# Patient Record
Sex: Male | Born: 1945 | Race: White | Hispanic: No | Marital: Married | Smoking: Never smoker
Health system: Southern US, Community
[De-identification: ages and names within clinical notes are randomized; demographics above are authoritative.]

## PROBLEM LIST (undated history)

## (undated) ENCOUNTER — Emergency Department (HOSPITAL_COMMUNITY): Admission: EM | Discharge status: Absent without leave | Payer: Medicare PPO

## (undated) DIAGNOSIS — M1990 Unspecified osteoarthritis, unspecified site: Secondary | ICD-10-CM

## (undated) DIAGNOSIS — T7840XA Allergy, unspecified, initial encounter: Secondary | ICD-10-CM

## (undated) DIAGNOSIS — J45909 Unspecified asthma, uncomplicated: Secondary | ICD-10-CM

## (undated) DIAGNOSIS — R7303 Prediabetes: Secondary | ICD-10-CM

## (undated) DIAGNOSIS — T8859XA Other complications of anesthesia, initial encounter: Secondary | ICD-10-CM

## (undated) DIAGNOSIS — F909 Attention-deficit hyperactivity disorder, unspecified type: Secondary | ICD-10-CM

## (undated) DIAGNOSIS — E785 Hyperlipidemia, unspecified: Secondary | ICD-10-CM

## (undated) DIAGNOSIS — I10 Essential (primary) hypertension: Secondary | ICD-10-CM

## (undated) DIAGNOSIS — F419 Anxiety disorder, unspecified: Secondary | ICD-10-CM

## (undated) HISTORY — DX: Unspecified asthma, uncomplicated: J45.909

## (undated) HISTORY — DX: Unspecified osteoarthritis, unspecified site: M19.90

## (undated) HISTORY — PX: CERVICAL FUSION: SHX112

## (undated) HISTORY — PX: OTHER SURGICAL HISTORY: SHX169

## (undated) HISTORY — DX: Hyperlipidemia, unspecified: E78.5

## (undated) HISTORY — PX: COLONOSCOPY: SHX174

## (undated) HISTORY — PX: LUMBAR FUSION: SHX111

## (undated) HISTORY — DX: Allergy, unspecified, initial encounter: T78.40XA

---
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
## 2008-01-11 HISTORY — PX: LUMBAR FUSION: SHX111

## 2012-11-16 LAB — LIPID PANEL
HDL: 62 (ref 35–70)
Triglycerides: 79 (ref 40–160)

## 2012-11-16 LAB — CBC AND DIFFERENTIAL: HCT: 49 (ref 41–53)

## 2012-11-16 LAB — CBC: RBC: 5.16 — AB (ref 3.87–5.11)

## 2013-09-11 LAB — CBC: RBC: 5.05 (ref 3.87–5.11)

## 2013-09-11 LAB — LIPID PANEL
Cholesterol: 186 (ref 0–200)
HDL: 56 (ref 35–70)
LDL Cholesterol: 110
Triglycerides: 98 (ref 40–160)

## 2013-09-11 LAB — CBC AND DIFFERENTIAL
HCT: 48 (ref 41–53)
Neutrophils Absolute: 4
WBC: 5

## 2014-01-10 HISTORY — PX: CERVICAL FUSION: SHX112

## 2015-11-26 LAB — CBC: RBC: 5.04 (ref 3.87–5.11)

## 2015-11-26 LAB — CBC AND DIFFERENTIAL
Hemoglobin: 46.4 — AB (ref 13.5–17.5)
WBC: 4.7

## 2016-02-29 LAB — HEMOGLOBIN A1C: Hemoglobin A1C: 5.9

## 2017-01-04 ENCOUNTER — Encounter: Payer: Self-pay | Admitting: Emergency Medicine

## 2017-01-04 ENCOUNTER — Ambulatory Visit
Admission: EM | Admit: 2017-01-04 | Discharge: 2017-01-04 | Disposition: A | Discharge status: Home / Self Care | Payer: Medicare PPO | Attending: Emergency Medicine | Admitting: Emergency Medicine

## 2017-01-04 ENCOUNTER — Other Ambulatory Visit: Payer: Self-pay

## 2017-01-04 DIAGNOSIS — J309 Allergic rhinitis, unspecified: Secondary | ICD-10-CM | POA: Diagnosis not present

## 2017-01-04 DIAGNOSIS — J019 Acute sinusitis, unspecified: Secondary | ICD-10-CM

## 2017-01-04 HISTORY — DX: Essential (primary) hypertension: I10

## 2017-01-04 MED ORDER — AEROCHAMBER PLUS MISC: 2 refills | Status: DC

## 2017-01-04 MED ORDER — IPRATROPIUM BROMIDE 0.06 % NA SOLN: 2.0000 | Freq: Four times a day (QID) | NASAL | 0 refills | Status: AC

## 2017-01-04 MED ORDER — ALBUTEROL SULFATE HFA 108 (90 BASE) MCG/ACT IN AERS: 1.0000 | INHALATION_SPRAY | Freq: Four times a day (QID) | RESPIRATORY_TRACT | 0 refills | Status: AC | PRN

## 2017-01-04 MED ORDER — PREDNISONE 20 MG PO TABS: 40.0000 mg | ORAL_TABLET | Freq: Every day | ORAL | 0 refills | Status: AC

## 2017-01-04 NOTE — Discharge Instructions (Signed)
Take the medication as written.  You may take 600 mg of motrin with 1 gram of tylenol up to 3-4 times a day as needed for pain. This is an effective combination for pain.  Your sinus infection appears to be from allergies, and does not appear to be infectious at this time.  I do not think that you need antibiotics right now  unless you have a high fever, have had this for 10 days, or you get better and then get sick again. Use a neti pot or the NeilMed sinus rinse as often as you want to to reduce nasal congestion. Follow the directions on the box.   discontinue the Zyrtec, start Claritin-D, or Allegra-D, and switch to plain Claritin or Allegra if your blood pressure goes up.  Prednisone 40 mg for 5 days, Atrovent nasal spray, saline nasal irrigation, albuterol inhaler with a spacer in case you start coughing or wheezing, and will provide a primary care referral.  May return here as needed.  Here is a list of primary care providers who are taking new patients:  Dr. Elizabeth Sauereanna Jones, Dr. Schuyler AmorWilliam Plonk [address] [place] [address] [phone]  [facility] [place] [address]  [place] [address]  [phone]  [facility] [address]  [place], [place] [address] [phone]  [facility] [place] [address]  [phone] [place], [place] [address]  Here are clinics/ other resources who will see you if you do not have insurance. Some have certain criteria that you must meet. Call them and find out what they are:  [facility]: [address] [place], [place] [address] Phone: [phone] Hours: First and Third Saturdays of each Month, 9 a.m. - 1 p.m.  [facility]: [address], [place], [place] [address] Phone: [phone] Hours: Tuesday, 4 p.m. - 8 p.m. Thursday, 1 p.m. - 8 p.m. Wednesday, 9 a.m. - [facility]  [facility] [address], [place], [place] [address] Phone: [phone] Pharmacy Phone  Number: [phone] Dental Phone Number: [phone] [facility] Insurance Help: [phone]  Dental Hours: Monday - Thursday, 8 a.m. - 6 p.m.  [facility] [address] [place], [place] [address] Phone: [phone] Pharmacy Phone Number: [phone] [facility] Insurance Help: [phone]  [facility] [address] [place], [place] [address] Phone: [phone] Pharmacy Phone Number: [phone] [facility] Insurance Help: [phone]  [facility] [address] [place], [place] [address] Phone: [phone] [facility] Insurance Help: [phone]   [facility] [address] [place], [place] [address] Phone: [phone]  Go to www.goodrx.com to look up your medications. This will give you a list of where you can find your prescriptions at the most affordable prices. Or ask the pharmacist what the cash price is, or if they have any other discount programs available to help make your medication more affordable. This can be less expensive than what you would pay with insurance.

## 2017-01-04 NOTE — ED Provider Notes (Signed)
HPI  SUBJECTIVE:  Gabriel Simpson is a 71 y.o. male who presents with tan/brown nasal congestion, rhinorrhea, postnasal drip, sneezing, itchy, watery eyes after being exposed to dusty boxes while packing for the past week.  He states that his face feels swollen especially in the morning.  Reports sinus pain and pressure, especially on the left maxillary sinuses.  Symptoms started 2 days ago. No cough, sore throat, ear pain.  His upper teeth do not hurt.  He is currently not doing any saline nasal irrigation or using any nasal steroid no coughing at night.  He had a sick contact last week with a sinus infection.  He tried Mucinex for day, Sudafed for a day.  He is on Zyrtec chronically.  He also tried Tylenol.  No alleviating factors.  No aggravating factors.  Past medical history of asthma, allergies, hypertension, sinusitis.  No history of diabetes.  [place] [facility]  States that they just moved here.    Past Medical History:  Diagnosis Date  . Hypertension     Past Surgical History:  Procedure Laterality Date  . thumb surgery Left     History reviewed. No pertinent family history.  Social History   Tobacco Use  . Smoking status: Never Smoker  . Smokeless tobacco: Never Used  Substance Use Topics  . Alcohol use: No    Frequency: Never  . Drug use: No    No current facility-administered medications for this encounter.   Current Outpatient Medications:  .  atorvastatin (LIPITOR) 20 MG tablet, Take 20 mg by mouth daily., Disp: , Rfl:  .  finasteride (PROSCAR) 5 MG tablet, Take 5 mg by mouth daily., Disp: , Rfl:  .  gabapentin (NEURONTIN) 100 MG capsule, Take 100 mg by mouth at bedtime., Disp: , Rfl:  .  losartan-hydrochlorothiazide (HYZAAR) 100-12.5 MG tablet, Take 1 tablet by mouth daily., Disp: , Rfl:  .  Multiple Vitamin (MULTIVITAMIN) tablet, Take 1 tablet by mouth daily., Disp: , Rfl:  .  sertraline (ZOLOFT) 50 MG tablet, Take 50 mg by mouth daily., Disp: , Rfl:  .   tamsulosin (FLOMAX) 0.4 MG CAPS capsule, Take 0.4 mg by mouth daily., Disp: , Rfl:  .  albuterol (PROVENTIL HFA;VENTOLIN HFA) 108 (90 Base) MCG/ACT inhaler, Inhale 1-2 puffs into the lungs every 6 (six) hours as needed for wheezing or shortness of breath., Disp: 1 Inhaler, Rfl: 0 .  ipratropium (ATROVENT) 0.06 % nasal spray, Place 2 sprays into both nostrils 4 (four) times daily. 3-4 times/ day, Disp: 15 mL, Rfl: 0 .  predniSONE (DELTASONE) 20 MG tablet, Take 2 tablets (40 mg total) by mouth daily with breakfast for 5 days., Disp: 10 tablet, Rfl: 0 .  Spacer/Aero-Holding Chambers (AEROCHAMBER PLUS) inhaler, Use as instructed, Disp: 1 each, Rfl: 2  No Known Allergies   ROS  As noted in HPI.   Physical Exam  BP 136/87 (BP Location: Left Arm)   Pulse 71   Temp 98.8 F (37.1 C) (Oral)   Resp 16   Ht 6' (1.829 m)   Wt 185 lb (83.9 kg)   SpO2 98%   BMI 25.09 kg/m   Constitutional: Well developed, well nourished, no acute distress Eyes:  EOMI, conjunctiva normal bilaterally HENT: Normocephalic, atraumatic,mucus membranes moist positive clear rhinorrhea.  Erythematous, but not swollen turbinates.  No sinus tenderness.  No appreciable facial swelling.  Normal oropharynx.  Positive postnasal drip.  Some cobblestoning. Respiratory: Normal inspiratory effort, lungs clear bilaterally. Cardiovascular: Normal rate GI:  nondistended skin: No rash, skin intact Musculoskeletal: no deformities Neurologic: Alert & oriented x 3, no focal neuro deficits Psychiatric: Speech and behavior appropriate   ED Course   Medications - No data to display  No orders of the defined types were placed in this encounter.   No results found for this or any previous visit (from the past 24 hour(s)). No results found.  ED Clinical Impression  Acute non-recurrent sinusitis, unspecified location  Allergic rhinitis, unspecified seasonality, unspecified trigger   ED Assessment/Plan  Presentation  consistent with allergic rhinitis/sinusitis.  We will have him discontinue the Zyrtec, start Claritin-D, or Allegra-D, and switch to plain Claritin or Allegra if his blood pressure goes up.  Prednisone 40 mg for 5 days, Atrovent nasal spray because this is worked well for him in the past, saline nasal irrigation, butyryl inhaler with a spacer in case he does start coughing or wheezing, and will provide a primary care referral addition to primary care referral list for routine management of his hypertension.  May return here as needed.   Meds ordered this encounter  Medications  . ipratropium (ATROVENT) 0.06 % nasal spray    Sig: Place 2 sprays into both nostrils 4 (four) times daily. 3-4 times/ day    Dispense:  15 mL    Refill:  0  . predniSONE (DELTASONE) 20 MG tablet    Sig: Take 2 tablets (40 mg total) by mouth daily with breakfast for 5 days.    Dispense:  10 tablet    Refill:  0  . Spacer/Aero-Holding Chambers (AEROCHAMBER PLUS) inhaler    Sig: Use as instructed    Dispense:  1 each    Refill:  2  . albuterol (PROVENTIL HFA;VENTOLIN HFA) 108 (90 Base) MCG/ACT inhaler    Sig: Inhale 1-2 puffs into the lungs every 6 (six) hours as needed for wheezing or shortness of breath.    Dispense:  1 Inhaler    Refill:  0    *This clinic note was created using Scientist, clinical (histocompatibility and immunogenetics)Dragon dictation software. Therefore, there may be occasional mistakes despite careful proofreading.   ?    Domenick GongMortenson, Alanny Rivers, MD 01/04/17 [phone]

## 2017-01-04 NOTE — ED Triage Notes (Signed)
Patient c/o sinus congestion and pressure that started 2 days ago. Patient denies fevers.  

## 2017-02-27 LAB — COMPREHENSIVE METABOLIC PANEL
Albumin: 3.8 (ref 3.5–5.0)
Calcium: 9.3 (ref 8.7–10.7)
GFR calc Af Amer: 91
GFR calc non Af Amer: 75

## 2017-02-27 LAB — BASIC METABOLIC PANEL
BUN: 20 (ref 4–21)
CO2: 24 — AB (ref 13–22)
Chloride: 100 (ref 99–108)
Creatinine: 1 (ref 0.6–1.3)
Glucose: 124
Potassium: 4.1 (ref 3.4–5.3)
Sodium: 138 (ref 137–147)

## 2017-02-27 LAB — CBC AND DIFFERENTIAL
HCT: 47 (ref 41–53)
Hemoglobin: 14.9 (ref 13.5–17.5)
Neutrophils Absolute: 4
Platelets: 246 (ref 150–399)
WBC: 5.3

## 2017-02-27 LAB — LIPID PANEL
Cholesterol: 181 (ref 0–200)
HDL: 48 (ref 35–70)
LDL Cholesterol: 86
Triglycerides: 248 — AB (ref 40–160)

## 2017-02-27 LAB — HEPATIC FUNCTION PANEL
ALT: 28 (ref 10–40)
AST: 26 (ref 14–40)
Alkaline Phosphatase: 44 (ref 25–125)
Bilirubin, Total: 1.1

## 2017-02-27 LAB — CBC: RBC: 4.98 (ref 3.87–5.11)

## 2017-02-27 LAB — TSH: TSH: 0.9 (ref 0.41–5.90)

## 2017-04-20 LAB — CBC AND DIFFERENTIAL
HCT: 48 (ref 41–53)
Hemoglobin: 16.1 (ref 13.5–17.5)
Neutrophils Absolute: 3
Platelets: 266 (ref 150–399)
WBC: 4.9

## 2017-04-20 LAB — COMPREHENSIVE METABOLIC PANEL
Albumin: 4.4 (ref 3.5–5.0)
Calcium: 10 (ref 8.7–10.7)
GFR calc Af Amer: 98
GFR calc non Af Amer: 81

## 2017-04-20 LAB — BASIC METABOLIC PANEL
BUN: 17 (ref 4–21)
CO2: 25 — AB (ref 13–22)
Chloride: 101 (ref 99–108)
Creatinine: 0.9 (ref 0.6–1.3)
Glucose: 81
Potassium: 4.4 (ref 3.4–5.3)
Sodium: 138 (ref 137–147)

## 2017-04-20 LAB — HEPATIC FUNCTION PANEL
ALT: 25 (ref 10–40)
AST: 30 (ref 14–40)
Alkaline Phosphatase: 43 (ref 25–125)
Bilirubin, Total: 1.2

## 2017-04-20 LAB — CBC: RBC: 5.08 (ref 3.87–5.11)

## 2017-11-17 LAB — BASIC METABOLIC PANEL
BUN: 15 (ref 4–21)
CO2: 29 — AB (ref 13–22)
Chloride: 101 (ref 99–108)
Creatinine: 0.9 (ref 0.6–1.3)
Glucose: 93
Potassium: 4.6 (ref 3.4–5.3)
Sodium: 139 (ref 137–147)

## 2017-11-17 LAB — HEPATIC FUNCTION PANEL
ALT: 22 (ref 10–40)
AST: 22 (ref 14–40)
Alkaline Phosphatase: 59 (ref 25–125)
Bilirubin, Total: 0.9

## 2017-11-17 LAB — TSH: TSH: 0.9 (ref <=5.90)

## 2017-11-17 LAB — LIPID PANEL
Cholesterol: 191 (ref 0–200)
HDL: 50 (ref 35–70)
LDL Cholesterol: 112
Triglycerides: 147 (ref 40–160)

## 2017-11-17 LAB — CBC AND DIFFERENTIAL
HCT: 45 (ref 41–53)
Hemoglobin: 14.3 (ref 13.5–17.5)
Neutrophils Absolute: 4
Platelets: 310 (ref 150–399)
WBC: 5.1

## 2017-11-17 LAB — COMPREHENSIVE METABOLIC PANEL
Albumin: 3.9 (ref 3.5–5.0)
Calcium: 9.3 (ref 8.7–10.7)
GFR calc Af Amer: 104
GFR calc non Af Amer: 86

## 2017-11-17 LAB — HEMOGLOBIN A1C: Hemoglobin A1C: 6.2

## 2017-11-17 LAB — CBC: RBC: 5.26 — AB (ref 3.87–5.11)

## 2018-01-10 HISTORY — PX: EYE SURGERY: SHX253

## 2019-06-14 ENCOUNTER — Encounter: Payer: Self-pay | Admitting: General Practice

## 2019-06-18 ENCOUNTER — Encounter: Payer: Self-pay | Admitting: General Practice

## 2019-06-18 ENCOUNTER — Encounter: Payer: Medicare PPO | Admitting: Family Medicine

## 2019-06-18 NOTE — Progress Notes (Signed)
Care Everywhere/thx dmf 

## 2019-06-26 ENCOUNTER — Other Ambulatory Visit: Payer: Self-pay

## 2019-06-27 ENCOUNTER — Encounter: Payer: Self-pay | Admitting: Family Medicine

## 2019-06-27 ENCOUNTER — Ambulatory Visit (INDEPENDENT_AMBULATORY_CARE_PROVIDER_SITE_OTHER): Payer: Medicare PPO | Admitting: Family Medicine

## 2019-06-27 VITALS — BP 120/78 | HR 87 | Temp 98.3°F | Ht 72.0 in | Wt 185.4 lb

## 2019-06-27 DIAGNOSIS — F419 Anxiety disorder, unspecified: Secondary | ICD-10-CM | POA: Insufficient documentation

## 2019-06-27 DIAGNOSIS — F900 Attention-deficit hyperactivity disorder, predominantly inattentive type: Secondary | ICD-10-CM | POA: Insufficient documentation

## 2019-06-27 MED ORDER — AMPHETAMINE-DEXTROAMPHET ER 20 MG PO CP24: 20.0000 mg | ORAL_CAPSULE | Freq: Every day | ORAL | 0 refills | Status: DC

## 2019-06-27 MED ORDER — SERTRALINE HCL 50 MG PO TABS: 50.0000 mg | ORAL_TABLET | Freq: Every day | ORAL | 1 refills | Status: AC

## 2019-06-27 MED ORDER — AMPHETAMINE-DEXTROAMPHET ER 20 MG PO CP24: 20.0000 mg | ORAL_CAPSULE | ORAL | 0 refills | Status: DC

## 2019-06-27 MED ORDER — AMPHETAMINE-DEXTROAMPHET ER 20 MG PO CP24: 20.0000 mg | ORAL_CAPSULE | ORAL | 0 refills | Status: AC

## 2019-06-27 NOTE — Patient Instructions (Signed)
Amphetamine; Dextroamphetamine extended-release capsules What is this medicine? AMPHETAMINE; DEXTROAMPHETAMINE (am FET a meen; dex troe am FET a meen) is used to treat attention-deficit hyperactivity disorder (ADHD). Federal law prohibits giving this medicine to any person other than the person for whom it was prescribed. Do not share this medicine with anyone else. This medicine may be used for other purposes; ask your health care provider or pharmacist if you have questions. COMMON BRAND NAME(S): Adderall XR, Mydayis What should I tell my health care provider before I take this medicine? They need to know if you have any of these conditions:  anxiety or panic attacks  circulation problems in fingers and toes  glaucoma  hardening or blockages of the arteries or heart blood vessels  heart disease or a heart defect  high blood pressure  history of a drug or alcohol abuse problem  history of stroke  kidney disease  liver disease  mental illness  seizures  suicidal thoughts, plans, or attempt; a previous suicide attempt by you or a family member  thyroid disease  Tourette's syndrome  an unusual or allergic reaction to dextroamphetamine, other amphetamines, other medicines, foods, dyes, or preservatives  pregnant or trying to get pregnant  breast-feeding How should I use this medicine? Take this medicine by mouth with a glass of water. Follow the directions on the prescription label. This medicine is taken just one time per day, usually in the morning after waking up. Take with or without food. Do not chew or crush this medicine. You may open the capsules and sprinkle the medicine on a spoonful of applesauce. If sprinkled on applesauce, take the dose immediately and do not crush or chew. Do not take your medicine more often than directed. A special MedGuide will be given to you by the pharmacist with each prescription and refill. Be sure to read this information carefully  each time. Talk to your pediatrician regarding the use of this medicine in children. While this drug may be prescribed for children as young as 6 years for selected conditions, precautions do apply. Some extended-release capsules are recommended for use only in children 13 years of age and older. Overdosage: If you think you have taken too much of this medicine contact a poison control center or emergency room at once. NOTE: This medicine is only for you. Do not share this medicine with others. What if I miss a dose? If you miss a dose, take it as soon as you can in the morning, but do not take it later in the day because it can cause trouble sleeping. If it is almost time for your next dose, take only that dose. Do not take double or extra doses. What may interact with this medicine? Do not take this medicine with any of the following medications:  MAOIs like Carbex, Eldepryl, Marplan, Nardil, and Parnate  other stimulant medicines for attention disorders This medicine may also interact with the following medications:  acetazolamide  alcohol  ammonium chloride  antacids  ascorbic acid  atomoxetine  caffeine  certain medicines for blood pressure  certain medicines for depression, anxiety, or psychotic disturbances  certain medicines for seizures like carbamazepine, phenobarbital, phenytoin  certain medicines for stomach problems like cimetidine, ranitidine, famotidine, esomeprazole, omeprazole, lansoprazole, pantoprazole  lithium  medicines for colds and breathing difficulties  medicines for diabetes  medicines or dietary supplements for weight loss or to stay awake  methenamine  narcotic medicines for pain  quinidine  ritonavir  sodium bicarbonate  St.   John's wort This list may not describe all possible interactions. Give your health care provider a list of all the medicines, herbs, non-prescription drugs, or dietary supplements you use. Also tell them if you  smoke, drink alcohol, or use illegal drugs. Some items may interact with your medicine. What should I watch for while using this medicine? Visit your doctor or health care professional for regular checks on your progress. This prescription requires that you follow special procedures with your doctor and pharmacy. You will need to have a new written prescription from your doctor every time you need a refill. This medicine may affect your concentration, or hide signs of tiredness. Until you know how this medicine affects you, do not drive, ride a bicycle, use machinery, or do anything that needs mental alertness. Alcohol should be avoided with some brands of this medicine. Talk to your doctor or health care professional if you have questions. Tell your doctor or health care professional if this medicine loses its effects, or if you feel you need to take more than the prescribed amount. Do not change the dosage without talking to your doctor or health care professional. Decreased appetite is a common side effect when starting this medicine. Eating small, frequent meals or snacks can help. Talk to your doctor if you continue to have poor eating habits. Height and weight growth of a child taking this medicine will be monitored closely. Do not take this medicine close to bedtime. It may prevent you from sleeping. If you are going to need surgery, an MRI, a CT scan, or other procedure, tell your doctor that you are taking this medicine. You may need to stop taking this medicine before the procedure. Tell your doctor or healthcare professional right away if you notice unexplained wounds on your fingers and toes while taking this medicine. You should also tell your healthcare provider if you experience numbness or pain, changes in the skin color, or sensitivity to temperature in your fingers or toes. What side effects may I notice from receiving this medicine? Side effects that you should report to your doctor or  health care professional as soon as possible:  allergic reactions like skin rash, itching or hives, swelling of the face, lips, or tongue  anxious  breathing problems  changes in emotions or moods  changes in vision  chest pain or chest tightness  fast, irregular heartbeat  fingers or toes feel numb, cool, painful  hallucination, loss of contact with reality  high blood pressure  males: prolonged or painful erection  seizures  signs and symptoms of serotonin syndrome like confusion, increased sweating, fever, tremor, stiff muscles, diarrhea  signs and symptoms of a stroke like changes in vision; confusion; trouble speaking or understanding; severe headaches; sudden numbness or weakness of the face, arm or leg; trouble walking; dizziness; loss of balance or coordination  suicidal thoughts or other mood changes  uncontrollable head, mouth, neck, arm, or leg movements Side effects that usually do not require medical attention (report to your doctor or health care professional if they continue or are bothersome):  dry mouth  headache  irritability  loss of appetite  nausea  trouble sleeping  weight loss This list may not describe all possible side effects. Call your doctor for medical advice about side effects. You may report side effects to FDA at 1-800-FDA-1088. Where should I keep my medicine? Keep out of the reach of children. This medicine can be abused. Keep your medicine in a safe place to   protect it from theft. Do not share this medicine with anyone. Selling or giving away this medicine is dangerous and against the law. Store at room temperature between 15 and 30 degrees C (59 and 86 degrees F). Keep container tightly closed. Protect from light. Throw away any unused medicine after the expiration date. NOTE: This sheet is a summary. It may not cover all possible information. If you have questions about this medicine, talk to your doctor, pharmacist, or health  care provider.  2020 Elsevier/Gold Standard (2016-02-21 13:37:27)  

## 2019-06-27 NOTE — Progress Notes (Signed)
New Patient Office Visit  Subjective:  Patient ID: Gabriel Simpson, male    DOB: August 22, 1945  Age: 74 y.o. MRN: [id number]  CC:  Chief Complaint  Patient presents with   Establish Care    New patient, establish care need refills on medications.     HPI Gabriel Simpson presents for establishment of care and follow-up for attention deficit disorder and anxiety.  Is taking Adderall XR 20 mg daily over the last 14 years.  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  He is accompanied by his wife.  Moved into the area to be with their invalid son who has cystic fibrosis and may need a double lung transplant.  Medication has worked well for him over this time.  Past medical history of hypertension, elevated cholesterol, diabetes and BPH.  His wife tells me that some of their children are also treated for this ADD.  Past Medical History:  Diagnosis Date   Allergy    Arthritis    Asthma    Hyperlipidemia    Hypertension     Past Surgical History:  Procedure Laterality Date   thumb surgery Left    TONSILLECTOMY  1955    Family History  Problem Relation Age of Onset   Arthritis Mother    Cancer Mother    Hearing loss Mother    Alcohol abuse Father    Asthma Father    COPD Father    Arthritis Daughter    5 / [place] Daughter    Asthma Son    Birth defects Son     Social History   Socioeconomic History   Marital status: Married    Spouse name: Not on file   Number of children: Not on file   Years of education: Not on file   Highest education level: Not on file  Occupational History   Not on file  Tobacco Use   Smoking status: Never Smoker   Smokeless tobacco: Never Used  Scientific laboratory technician Use: Never used  Substance and Sexual Activity   Alcohol use: No   Drug use: No   Sexual activity: Yes  Other Topics Concern   Not on file  Social History Narrative   Not on file   Social Determinants of Health    Financial Resource Strain:    Difficulty of Paying Living Expenses:   Food Insecurity:    Worried About Charity fundraiser in the Last Year:    Arboriculturist in the Last Year:   Transportation Needs:    Film/video editor (Medical):    Lack of Transportation (Non-Medical):   Physical Activity:    Days of Exercise per Week:    Minutes of Exercise per Session:   Stress:    Feeling of Stress :   Social Connections:    Frequency of Communication with Friends and Family:    Frequency of Social Gatherings with Friends and Family:    Attends Religious Services:    Active Member of Clubs or Organizations:    Attends Music therapist:    Marital Status:   Intimate Partner Violence:    Fear of Current or Ex-Partner:    Emotionally Abused:    Physically Abused:    Sexually Abused:     ROS Review of Systems  Constitutional: Negative.   Respiratory: Negative.   Cardiovascular: Positive for palpitations.  Gastrointestinal: Negative.   Genitourinary: Negative for difficulty urinating, frequency and urgency.  Psychiatric/Behavioral:  Positive for decreased concentration. The patient is nervous/anxious.    Depression screen [facility] 2/9 06/27/2019  Decreased Interest 0  Down, Depressed, Hopeless 0  PHQ - 2 Score 0    Objective:   Today's Vitals: BP 120/78    Pulse 87    Temp 98.3 F (36.8 C) (Tympanic)    Ht 6' (1.829 m)    Wt 185 lb 6.4 oz (84.1 kg)    SpO2 97%    BMI 25.14 kg/m   Physical Exam Vitals and nursing note reviewed.  Constitutional:      Appearance: Normal appearance.  HENT:     Head: Normocephalic and atraumatic.     Right Ear: External ear normal.     Left Ear: External ear normal.  Eyes:     General: No scleral icterus.       Right eye: No discharge.        Left eye: No discharge.     Conjunctiva/sclera: Conjunctivae normal.  Cardiovascular:     Rate and Rhythm: Normal rate and regular rhythm.  Pulmonary:     Effort:  Pulmonary effort is normal.     Breath sounds: Normal breath sounds.  Neurological:     General: No focal deficit present.     Mental Status: He is alert and oriented to person, place, and time.  Psychiatric:        Mood and Affect: Mood normal.        Behavior: Behavior normal.     Assessment & Plan:   Problem List Items Addressed This Visit      Other   Anxiety   Relevant Medications   sertraline (ZOLOFT) 50 MG tablet   Attention deficit hyperactivity disorder (ADHD), predominantly inattentive type - Primary   Relevant Medications   amphetamine-dextroamphetamine (ADDERALL XR) 20 MG 24 hr capsule   amphetamine-dextroamphetamine (ADDERALL XR) 20 MG 24 hr capsule   amphetamine-dextroamphetamine (ADDERALL XR) 20 MG 24 hr capsule      Outpatient Encounter Medications as of 06/27/2019  Medication Sig   albuterol (PROVENTIL HFA;VENTOLIN HFA) 108 (90 Base) MCG/ACT inhaler Inhale 1-2 puffs into the lungs every 6 (six) hours as needed for wheezing or shortness of breath.   amphetamine-dextroamphetamine (ADDERALL XR) 20 MG 24 hr capsule Take 1 capsule (20 mg total) by mouth daily. Fill today.   aspirin 81 MG chewable tablet Chew 81 mg by mouth daily.   atorvastatin (LIPITOR) 20 MG tablet Take 20 mg by mouth daily.   cetirizine (ZYRTEC) 5 MG tablet Take by mouth.   finasteride (PROSCAR) 5 MG tablet Take 5 mg by mouth daily.   gabapentin (NEURONTIN) 100 MG capsule Take 100 mg by mouth at bedtime.   losartan-hydrochlorothiazide (HYZAAR) 100-12.5 MG tablet Take 1 tablet by mouth daily.   meloxicam (MOBIC) 7.5 MG tablet Take by mouth.   metFORMIN (GLUCOPHAGE) 500 MG tablet Take by mouth.   Multiple Vitamin (MULTIVITAMIN) tablet Take 1 tablet by mouth daily.   sertraline (ZOLOFT) 50 MG tablet Take 1 tablet (50 mg total) by mouth daily.   Spacer/Aero-Holding Chambers (AEROCHAMBER PLUS) inhaler Use as instructed   tamsulosin (FLOMAX) 0.4 MG CAPS capsule Take 0.4 mg by mouth  daily.   [DISCONTINUED] amphetamine-dextroamphetamine (ADDERALL XR) 20 MG 24 hr capsule Take by mouth.   [DISCONTINUED] sertraline (ZOLOFT) 50 MG tablet Take 50 mg by mouth daily.   amphetamine-dextroamphetamine (ADDERALL XR) 20 MG 24 hr capsule Take 1 capsule (20 mg total) by mouth every morning.   amphetamine-dextroamphetamine (ADDERALL  XR) 20 MG 24 hr capsule Take 1 capsule (20 mg total) by mouth every morning.   ipratropium (ATROVENT) 0.06 % nasal spray Place 2 sprays into both nostrils 4 (four) times daily. 3-4 times/ day (Patient not taking: Reported on 06/27/2019)   No facility-administered encounter medications on file as of 06/27/2019.    Follow-up: Return in about 1 week (around 07/04/2019).   Will return in the next week or so for follow-up of his other medical issues.  He understands that he will be returning every 90 days for refills on his Adderall and the focus of those visit will be on his attention deficit disorder.  Mliss Sax, MD

## 2019-10-28 ENCOUNTER — Other Ambulatory Visit: Payer: Self-pay | Admitting: Nurse Practitioner

## 2019-10-28 DIAGNOSIS — Z981 Arthrodesis status: Secondary | ICD-10-CM

## 2019-11-05 ENCOUNTER — Ambulatory Visit: Payer: Medicare PPO

## 2019-11-05 ENCOUNTER — Ambulatory Visit
Admission: RE | Admit: 2019-11-05 | Discharge: 2019-11-05 | Disposition: A | Discharge status: Home / Self Care | Payer: Medicare PPO | Source: Ambulatory Visit | Attending: Nurse Practitioner | Admitting: Nurse Practitioner

## 2020-05-06 ENCOUNTER — Other Ambulatory Visit: Payer: Self-pay | Admitting: Student

## 2020-05-06 DIAGNOSIS — M7581 Other shoulder lesions, right shoulder: Secondary | ICD-10-CM

## 2020-05-18 ENCOUNTER — Ambulatory Visit
Admission: RE | Admit: 2020-05-18 | Discharge: 2020-05-18 | Disposition: A | Discharge status: Home / Self Care | Payer: Medicare PPO | Source: Ambulatory Visit | Attending: Student | Admitting: Student

## 2020-05-18 ENCOUNTER — Other Ambulatory Visit: Payer: Self-pay

## 2020-05-18 DIAGNOSIS — M7581 Other shoulder lesions, right shoulder: Secondary | ICD-10-CM

## 2020-05-29 ENCOUNTER — Other Ambulatory Visit: Payer: Self-pay

## 2020-05-29 ENCOUNTER — Ambulatory Visit
Admission: RE | Admit: 2020-05-29 | Discharge: 2020-05-29 | Disposition: A | Discharge status: Home / Self Care | Payer: Medicare PPO | Source: Ambulatory Visit | Attending: Student | Admitting: Student

## 2020-05-29 DIAGNOSIS — M7581 Other shoulder lesions, right shoulder: Secondary | ICD-10-CM

## 2020-05-29 NOTE — Progress Notes (Signed)
Patient placed on schedule for Myelogram 06/03/2020, called and spoke with patient on phone with pre procedure instructions given. Made aware to hold adderall/zoloft/ with LD 5/22 prior to procedure. Will do liquids after MN prior to procedure/ NPO after 0430, and driver post procedure. Made aware he will be here at least 5 hours for pre and post procedure. He will also hold aspirin 2 days prior. Stated understanding.

## 2020-05-31 ENCOUNTER — Emergency Department (HOSPITAL_COMMUNITY): Payer: Medicare PPO

## 2020-05-31 ENCOUNTER — Emergency Department (HOSPITAL_BASED_OUTPATIENT_CLINIC_OR_DEPARTMENT_OTHER): Payer: Medicare PPO

## 2020-05-31 ENCOUNTER — Observation Stay (HOSPITAL_BASED_OUTPATIENT_CLINIC_OR_DEPARTMENT_OTHER)
Admission: EM | Admit: 2020-05-31 | Discharge: 2020-06-01 | Disposition: A | Discharge status: Home / Self Care | Payer: Medicare PPO | Attending: Family Medicine | Admitting: Family Medicine

## 2020-05-31 ENCOUNTER — Encounter (HOSPITAL_BASED_OUTPATIENT_CLINIC_OR_DEPARTMENT_OTHER): Payer: Self-pay | Admitting: Emergency Medicine

## 2020-05-31 ENCOUNTER — Other Ambulatory Visit: Payer: Self-pay

## 2020-05-31 DIAGNOSIS — R4182 Altered mental status, unspecified: Principal | ICD-10-CM | POA: Diagnosis present

## 2020-05-31 DIAGNOSIS — Z79899 Other long term (current) drug therapy: Secondary | ICD-10-CM | POA: Diagnosis not present

## 2020-05-31 DIAGNOSIS — Z20822 Contact with and (suspected) exposure to covid-19: Secondary | ICD-10-CM | POA: Diagnosis not present

## 2020-05-31 DIAGNOSIS — M21371 Foot drop, right foot: Secondary | ICD-10-CM

## 2020-05-31 DIAGNOSIS — G454 Transient global amnesia: Secondary | ICD-10-CM | POA: Diagnosis not present

## 2020-05-31 DIAGNOSIS — R7303 Prediabetes: Secondary | ICD-10-CM | POA: Diagnosis not present

## 2020-05-31 DIAGNOSIS — J45909 Unspecified asthma, uncomplicated: Secondary | ICD-10-CM | POA: Insufficient documentation

## 2020-05-31 DIAGNOSIS — G629 Polyneuropathy, unspecified: Secondary | ICD-10-CM

## 2020-05-31 DIAGNOSIS — Z7984 Long term (current) use of oral hypoglycemic drugs: Secondary | ICD-10-CM | POA: Insufficient documentation

## 2020-05-31 DIAGNOSIS — I10 Essential (primary) hypertension: Secondary | ICD-10-CM

## 2020-05-31 DIAGNOSIS — Z7982 Long term (current) use of aspirin: Secondary | ICD-10-CM | POA: Diagnosis not present

## 2020-05-31 LAB — CBC WITH DIFFERENTIAL/PLATELET
Abs Immature Granulocytes: 0.03 10*3/uL (ref 0.00–0.07)
Basophils Absolute: 0 10*3/uL (ref 0.0–0.1)
Basophils Relative: 0 %
Eosinophils Absolute: 0.2 10*3/uL (ref 0.0–0.5)
Eosinophils Relative: 3 %
HCT: 48.6 % (ref 39.0–52.0)
Hemoglobin: 16.2 g/dL (ref 13.0–17.0)
Immature Granulocytes: 1 %
Lymphocytes Relative: 12 %
Lymphs Abs: 0.7 10*3/uL (ref 0.7–4.0)
MCH: 31.6 pg (ref 26.0–34.0)
MCHC: 33.3 g/dL (ref 30.0–36.0)
MCV: 94.7 fL (ref 80.0–100.0)
Monocytes Absolute: 0.4 10*3/uL (ref 0.1–1.0)
Monocytes Relative: 7 %
Neutro Abs: 4.7 10*3/uL (ref 1.7–7.7)
Neutrophils Relative %: 77 %
Platelets: 283 10*3/uL (ref 150–400)
RBC: 5.13 MIL/uL (ref 4.22–5.81)
RDW: 13.2 % (ref 11.5–15.5)
WBC: 6.1 10*3/uL (ref 4.0–10.5)
nRBC: 0 % (ref 0.0–0.2)

## 2020-05-31 LAB — VITAMIN B12: Vitamin B-12: 746 pg/mL (ref 180–914)

## 2020-05-31 LAB — URINALYSIS, ROUTINE W REFLEX MICROSCOPIC
Bilirubin Urine: NEGATIVE
Glucose, UA: NEGATIVE mg/dL
Hgb urine dipstick: NEGATIVE
Ketones, ur: NEGATIVE mg/dL
Leukocytes,Ua: NEGATIVE
Nitrite: NEGATIVE
Protein, ur: NEGATIVE mg/dL
Specific Gravity, Urine: 1.01 (ref 1.005–1.030)
pH: 6 (ref 5.0–8.0)

## 2020-05-31 LAB — COMPREHENSIVE METABOLIC PANEL
ALT: 23 U/L (ref 0–44)
AST: 29 U/L (ref 15–41)
Albumin: 4.4 g/dL (ref 3.5–5.0)
Alkaline Phosphatase: 43 U/L (ref 38–126)
Anion gap: 8 (ref 5–15)
BUN: 19 mg/dL (ref 8–23)
CO2: 27 mmol/L (ref 22–32)
Calcium: 9.2 mg/dL (ref 8.9–10.3)
Chloride: 102 mmol/L (ref 98–111)
Creatinine, Ser: 1.08 mg/dL (ref 0.61–1.24)
GFR, Estimated: >60 mL/min (ref >60)
Glucose, Bld: 110 mg/dL — ABNORMAL HIGH (ref 70–99)
Potassium: 3.9 mmol/L (ref 3.5–5.1)
Sodium: 137 mmol/L (ref 135–145)
Total Bilirubin: 0.7 mg/dL (ref 0.3–1.2)
Total Protein: 7.2 g/dL (ref 6.5–8.1)

## 2020-05-31 LAB — HIV ANTIBODY (ROUTINE TESTING W REFLEX): HIV Screen 4th Generation wRfx: NONREACTIVE

## 2020-05-31 LAB — TSH: TSH: 1.659 u[IU]/mL (ref 0.350–4.500)

## 2020-05-31 LAB — HEMOGLOBIN A1C
Hgb A1c MFr Bld: 6.2 % — ABNORMAL HIGH (ref 4.8–5.6)
Mean Plasma Glucose: 131.24 mg/dL

## 2020-05-31 LAB — RESP PANEL BY RT-PCR (FLU A&B, COVID) ARPGX2
Influenza A by PCR: NEGATIVE
Influenza B by PCR: NEGATIVE
SARS Coronavirus 2 by RT PCR: NEGATIVE

## 2020-05-31 MED ORDER — FINASTERIDE 5 MG PO TABS
5.0000 mg | ORAL_TABLET | Freq: Every day | ORAL | Status: DC
  Administered 2020-06-01: 5 mg via ORAL
  Filled 2020-05-31: qty 1

## 2020-05-31 MED ORDER — ATORVASTATIN CALCIUM 10 MG PO TABS
20.0000 mg | ORAL_TABLET | Freq: Every day | ORAL | Status: DC
  Administered 2020-06-01: 20 mg via ORAL
  Filled 2020-05-31: qty 2

## 2020-05-31 MED ORDER — TAMSULOSIN HCL 0.4 MG PO CAPS
0.4000 mg | ORAL_CAPSULE | Freq: Every day | ORAL | Status: DC
  Administered 2020-06-01: 0.4 mg via ORAL
  Filled 2020-05-31: qty 1

## 2020-05-31 MED ORDER — INSULIN ASPART 100 UNIT/ML IJ SOLN
0.0000 [IU] | Freq: Three times a day (TID) | INTRAMUSCULAR | Status: DC
  Administered 2020-06-01 (×2): 1 [IU] via SUBCUTANEOUS

## 2020-05-31 MED ORDER — LOSARTAN POTASSIUM 50 MG PO TABS
100.0000 mg | ORAL_TABLET | Freq: Every day | ORAL | Status: DC
  Administered 2020-06-01: 100 mg via ORAL
  Filled 2020-05-31: qty 2

## 2020-05-31 MED ORDER — ASPIRIN 81 MG PO CHEW
81.0000 mg | CHEWABLE_TABLET | Freq: Every day | ORAL | Status: DC
  Filled 2020-05-31: qty 1

## 2020-05-31 MED ORDER — GABAPENTIN 100 MG PO CAPS
200.0000 mg | ORAL_CAPSULE | Freq: Every day | ORAL | Status: DC
  Administered 2020-06-01: 200 mg via ORAL
  Filled 2020-05-31: qty 2

## 2020-05-31 MED ORDER — LOSARTAN POTASSIUM-HCTZ 100-12.5 MG PO TABS: 1.0000 | ORAL_TABLET | Freq: Every day | ORAL | Status: DC

## 2020-05-31 MED ORDER — ENOXAPARIN SODIUM 40 MG/0.4ML IJ SOSY
40.0000 mg | PREFILLED_SYRINGE | Freq: Every day | INTRAMUSCULAR | Status: DC
  Filled 2020-05-31: qty 0.4

## 2020-05-31 MED ORDER — HYDROCHLOROTHIAZIDE 25 MG PO TABS
25.0000 mg | ORAL_TABLET | Freq: Every day | ORAL | Status: DC
  Administered 2020-06-01: 25 mg via ORAL
  Filled 2020-05-31: qty 1

## 2020-05-31 NOTE — ED Triage Notes (Signed)
Wife reports when she left at 1330 he was in the chair with the cat.  When she arrived back home around 1415 he was standing in the kitchen confused.  He reports he doesn't feel right.  Per wife he knows who everyone is but can't recall where his mother is and she has passed.  He doesn't recall anything from last week.  No neuro deficits noted in triage.  Wife does report he fell last night.  Reports he hit his head but is not on a blood thinner just baby aspirin.

## 2020-05-31 NOTE — ED Notes (Signed)
Patient transported to CT 

## 2020-05-31 NOTE — Consult Note (Signed)
Neurology Consultation Reason for Consult: Confusional episode Requesting Physician: Pricilla Loveless  CC: Confusion  History is obtained from: Patient and wife at bedside as well as chart review  HPI: Gabriel Simpson, "Gabriel Simpson", is a 75 y.o. male with a past medical history significant for hypertension, hyperlipidemia, degenerative disc disease with C, T and C-spine surgeries, anxiety/ADHD.  He has been in his normal state of health lately with some mild concern for short-term memory issues such as repeatedly checking the front door to make sure it is locked.  His wife last saw him normal when she left the house around noon.  When she returned at 2:30 PM he was standing looking over male and was not answering her questions (replying repeatedly "I do not know" when she asked him what he was doing).  Her daughter who is a Engineer, civil (consulting) and lives with them performing a neurological assessment which was normal and he knew his date of birth and could identify his family members but could not identify the day of the week nor the president.  Additionally he did not remember that they had buried his mom's ashes the week prior (she passed away in Jan 26, 2022 and had last lived in [facility] in 2017 which is where he identified her location).  The patient reports he does not remember his family performing a neuro assessment or any of the details of his interactions with although his memory is returning now and he is currently aware of the president etc.  On review of systems wife notes that the day prior he had tripped on a bag and has frequent falls secondary to his spinal cord issues though these have improved with use of a right leg brace.  He landed on his elbows and had not sustained any head trauma.  Additionally they are concerned about the potential for one of his screws backing out in his spine for which she is scheduled for CT myelogram and also has planned right shoulder replacement.  He stays mentally and  physically active and in particular has been concerned about his right arm weakness secondary to difficulty playing some of the instruments that he enjoys (guitar, piano, violin, Special educational needs teacher).  He drinks a beer about once a week, use marijuana remotely but denies any other substance use.  He denies any recent headaches although his wife notes he had a headache last week while packing for the trip for which she took Tylenol.  Additionally he had COVID-19 in 01-17-2020and has reduced taste and smell since then.   LKW: Noted on 520 02/2020 tPA given?: No, low concern for stroke Premorbid modified rankin scale:      0 - No symptoms.  ROS: All other review of systems was negative except as noted in the HPI.   Past Medical History:  Diagnosis Date  . Allergy   . Arthritis   . Asthma   . Hyperlipidemia   . Hypertension    Past Surgical History:  Procedure Laterality Date  . thumb surgery Left   . TONSILLECTOMY  1955  -C-spine, L-spine and T-spine surgeries  Family History  Problem Relation Age of Onset  . Arthritis Mother   . Cancer Mother   . Hearing loss Mother   . Alcohol abuse Father   . Asthma Father   . COPD Father   . Arthritis Daughter   . Miscarriages / [place] Daughter   . Asthma Son   . Birth defects Son    Social History:  reports  that he has never smoked. He has never used smokeless tobacco. He reports that he does not drink alcohol and does not use drugs. To me he reports drinking 1 beer a week and remote marijuana use in college  Exam: Current vital signs: BP (!) 133/93   Pulse 83   Temp 98.3 F (36.8 C) (Oral)   Resp 18   Ht 6' (1.829 m)   Wt 84 kg   SpO2 96%   BMI 25.12 kg/m  Vital signs in last 24 hours: Temp:  [98.3 F (36.8 C)-98.8 F (37.1 C)] 98.3 F (36.8 C) (05/22 1639) Pulse Rate:  [81-98] 83 (05/22 1843) Resp:  [16-18] 18 (05/22 1843) BP: (133-143)/(90-100) 133/93 (05/22 1843) SpO2:  [95 %-96 %] 96 % (05/22 1843) Weight:  [84 kg]  84 kg (05/22 1545)   Physical Exam  Constitutional: Appears well-developed and well-nourished.  Psych: Affect appropriate to situation, calm, cooperative, slightly concerned about his recent lapse in memory Eyes: No scleral injection HENT: No oropharyngeal obstruction.  Good dentition MSK: no joint deformities.  Orthotic in place on the right lower extremity Cardiovascular: Perfusing extremities well, normal rate and regular rhythm Respiratory: Effort normal, non-labored breathing GI: Soft.  No distension. There is no tenderness.  Skin: Warm dry and intact visible skin  Neuro: Mental Status: Patient is awake, alert, oriented to person, place, month, year, and situation. Patient is able to give a clear and coherent history. No signs of aphasia or neglect Cranial Nerves: II: Visual Fields are full. Pupils are equal, round, and reactive to light.   III,IV, VI: EOMI without ptosis or diploplia.  Mildly saccadic pursuits V: Facial sensation is symmetric to temperature VII: Facial movement is symmetric.  VIII: hearing is intact to voice X: Uvula elevates symmetrically XI: Shoulder shrug is symmetric. XII: tongue is midline without atrophy or fasciculations.  Motor: Bulk is normal. 5/5 strength was present in all four extremities.  Sensory: Sensation is symmetric to light touch and temperature in the arms and legs. Deep Tendon Reflexes: 2+ on the right patella, 3+ left.  2+ and symmetric in the biceps Cerebellar: FNF and HKS are intact bilaterally   I have reviewed labs in epic and the results pertinent to this consultation are: CMP with mildly elevated glucose at 110 CBC within normal limits Hemoglobin A1c increased from 5.9 -> 6.2 (8/21 -> 11/22) Lipid panel within normal limits on 8/21 (reviewed in CareEverywhere)  I have reviewed the images obtained: MRI brain without acute intracranial process, fairly mild white matter changes  Impression: This is a 75 year old male with  past medical history significant for hypertension, hyperlipidemia and significant prior spinal surgery, ADHD/anxiety.  He had a few hours episode today of confusion with memory loss that appears to have resolved at this time.  Transient global amnesia is on the differential although this is a diagnosis of exclusion and requires EEG to rule out seizure activity.  Reassuringly MRI brain is normal.  I will also obtain basic encephalopathy labs to look for reversible causes of confusion.  Recommendations: -TSH, A1c, RPR, HIV, B12, B1; should abnormalities be found please treat as needed  Note Goal B12 level is greater than 400 -Routine EEG -Neurology will follow up EEG and consider initiation of antiseizure medication on 5/23 if needed, but if the study is negative outpatient neurology follow-up including outpatient neurocognitive testing is recommended  Brooke Dare MD-PhD [facility] [phone] Available 7 PM to 7 AM, outside of these hours please call Neurologist on  call as listed on Amion.

## 2020-05-31 NOTE — H&P (Signed)
History and Physical    Gabriel Simpson Figley HYQ:[id number] DOB: 06/25/1945 DOA: 05/31/2020  PCP: Mick SellFitzgerald, David P, MD  Patient coming from: Home, wife at bedside  I have personally briefly reviewed patient's old medical [facility]  Chief Complaint: memory loss  HPI: Gabriel Simpson Harshberger is a 75 y.o. male with medical history significant for hypertension, prediabetes, peripheral neuropathy with recent right foot drop, anxiety and hyperlipidemia who presents with concerns of memory loss.  Patient was otherwise in his normal state of health this morning and had breakfast with family.  His wife then left the house for a few hours.  When she returned she found him standing over the breakfast counter and asked him what was going on.  Initially he did not reply.  When asked again that he kept on repeating "I do not know" and also kept saying that he felt something was wrong.  His daughter who is a nurse began to assess him for stroke symptoms but did not find any focal neurological deficits.  At that time he denied any weakness or numbness to them.  He recognize who they were but was unable to remember their birthday.  He  [place] mother's [place] but was unable to recall anything regarding that event.  Pt now is alert and oriented x 4 but can only remember the earlier parts of today and does not recall his interactions with his wife and daughter today.  Wife also reports that yesterday he tripped but was able to break his fall with his arm.  He has been having issues with falls recently due to a new right foot drop and is currently wearing a brace.  He has had extensive back surgery and currently has a myelogram planned later this week.  He denies any tobacco use.  Has occasional alcohol use.  Has used marijuana remotely in his college years.  ED Course: [facility]. MRI negative for acute findings.  However neurology is concerns about seizure and pt will be admitted for EEG workup. CBC and CMP otherwise unremarkable.   Review of Systems:  Constitutional: No Weight Change, No Fever ENT/Mouth: No sore throat, No Rhinorrhea Eyes: No Eye Pain, No Vision Changes Cardiovascular: No Chest Pain, no SOB Respiratory: No Cough, No Sputum, No Wheezing, no Dyspnea  Gastrointestinal: No Nausea, No Vomiting, No Diarrhea, No Constipation, No Pain Genitourinary: no Urinary Incontinence, No Urgency, No Flank Pain Musculoskeletal: No Arthralgias, No Myalgias Skin: No Skin Lesions, No Pruritus, Neuro: no Weakness, No Numbness Psych: No Anxiety/Panic, No Depression, no decrease appetite Heme/Lymph: No Bruising, No Bleeding  Past Medical History:  Diagnosis Date  . Allergy   . Arthritis   . Asthma   . Hyperlipidemia   . Hypertension     Past Surgical History:  Procedure Laterality Date  . thumb surgery Left   . TONSILLECTOMY  1955     reports that he has never smoked. He has never used smokeless tobacco. He reports that he does not drink alcohol and does not use drugs. Social History  No Known Allergies  Family History  Problem Relation Age of Onset  . Arthritis Mother   . Cancer Mother   . Hearing loss Mother   . Alcohol abuse Father   . Asthma Father   . COPD Father   . Arthritis Daughter   . Miscarriages / [place]   .  Asthma Son   . Birth defects Son      Prior to Admission medications   Medication Sig Start Date End Date Taking? Authorizing Provider  albuterol (PROVENTIL HFA;VENTOLIN HFA) 108 (90 Base) MCG/ACT inhaler Inhale 1-2 puffs into the lungs every 6 (six) hours as needed for wheezing or shortness of breath. 01/04/17   Domenick Gong, MD  amphetamine-dextroamphetamine (ADDERALL XR) 20 MG 24 hr capsule Take 1 capsule (20 mg total) by mouth daily. Fill today. 06/27/19 07/27/19  Mliss Sax, MD  amphetamine-dextroamphetamine (ADDERALL XR) 20 MG  24 hr capsule Take 1 capsule (20 mg total) by mouth every morning. 06/27/19   Mliss Sax, MD  amphetamine-dextroamphetamine (ADDERALL XR) 20 MG 24 hr capsule Take 1 capsule (20 mg total) by mouth every morning. 06/27/19   Mliss Sax, MD  aspirin 81 MG chewable tablet Chew 81 mg by mouth daily.    [provider]  atorvastatin (LIPITOR) 20 MG tablet Take 20 mg by mouth daily.    [provider]  cetirizine (ZYRTEC) 5 MG tablet Take by mouth.    [provider]  finasteride (PROSCAR) 5 MG tablet Take 5 mg by mouth daily.    [provider]  gabapentin (NEURONTIN) 100 MG capsule Take 100 mg by mouth at bedtime.    [provider]  ipratropium (ATROVENT) 0.06 % nasal spray Place 2 sprays into both nostrils 4 (four) times daily. 3-4 times/ day Patient not taking: Reported on 06/27/2019 01/04/17   Domenick Gong, MD  losartan-hydrochlorothiazide [facility]) 100-12.5 MG tablet Take 1 tablet by mouth daily.    [provider]  meloxicam (MOBIC) 7.5 MG tablet Take by mouth. 04/22/19   [provider]  metFORMIN (GLUCOPHAGE) 500 MG tablet Take by mouth. 04/22/19   [provider]  Multiple Vitamin (MULTIVITAMIN) tablet Take 1 tablet by mouth daily.    [provider]  sertraline (ZOLOFT) 50 MG tablet Take 1 tablet (50 mg total) by mouth daily. 06/27/19   Mliss Sax, MD  Spacer/Aero-Holding Chambers (AEROCHAMBER PLUS) inhaler Use as instructed 01/04/17   Domenick Gong, MD  tamsulosin (FLOMAX) 0.4 MG CAPS capsule Take 0.4 mg by mouth daily.    [provider]    Physical Exam: Vitals:   05/31/20 2045 05/31/20 2100 05/31/20 2130 05/31/20 2200  BP: 139/82 128/89 (!) 153/94 (!) 145/98  Pulse: 64 63 70 64  Resp: 14 11 13 19   Temp:      TempSrc:      SpO2: 97% 96% 99% 97%  Weight:      Height:        Constitutional: NAD, calm, comfortable, elderly gentleman laying at 30 degree  incline in bed Vitals:   05/31/20 2045 05/31/20 2100 05/31/20 2130 05/31/20 2200  BP: 139/82 128/89 (!) 153/94 (!) 145/98  Pulse: 64 63 70 64  Resp: 14 11 13 19   Temp:      TempSrc:      SpO2: 97% 96% 99% 97%  Weight:      Height:       Eyes: PERRL, lids and conjunctivae normal ENMT: Mucous membranes are moist.  Neck: normal, supple Respiratory: clear to auscultation bilaterally, no wheezing, no crackles. Normal respiratory effort. No accessory muscle use.  Cardiovascular: Regular rate and rhythm, no murmurs / rubs / gallops. No extremity edema. 2+ pedal pulses. No carotid bruits.  Abdomen: no tenderness, no masses palpated.  Bowel sounds positive.  Musculoskeletal: no clubbing / cyanosis. No joint  deformity upper and lower extremities. Right foot brace.  Skin: no rashes, lesions, ulcers. No induration Neurologic: CN 2-12 grossly intact. Sensation intact,  Strength 5/5 in all 4. Limited right foot plantar and dorsiflexion due to right leg brace and foot drop. Psychiatric: Normal judgment and insight. Alert and oriented x 3. Normal mood.     Labs on Admission: I have personally reviewed following labs and imaging studies  CBC: Recent Labs  Lab 05/31/20 1557  WBC 6.1  NEUTROABS 4.7  HGB 16.2  HCT 48.6  MCV 94.7  PLT 283   Basic Metabolic Panel: Recent Labs  Lab 05/31/20 1557  NA 137  K 3.9  CL 102  CO2 27  GLUCOSE 110*  BUN 19  CREATININE 1.08  CALCIUM 9.2   GFR: Estimated Creatinine Clearance: 67.9 mL/min (by C-G formula based on SCr of 1.08 mg/dL). Liver Function Tests: Recent Labs  Lab 05/31/20 1557  AST 29  ALT 23  ALKPHOS 43  BILITOT 0.7  PROT 7.2  ALBUMIN 4.4   No results for input(s): LIPASE, AMYLASE in the last 168 hours. No results for input(s): AMMONIA in the last 168 hours. Coagulation Profile: No results for input(s): INR, PROTIME in the last 168 hours. Cardiac Enzymes: No results for input(s): CKTOTAL, CKMB, CKMBINDEX, TROPONINI in the  last 168 hours. BNP (last 3 results) No results for input(s): PROBNP in the last 8760 hours. HbA1C: Recent Labs    05/31/20 2202  HGBA1C 6.2*   CBG: No results for input(s): GLUCAP in the last 168 hours. Lipid Profile: No results for input(s): CHOL, HDL, LDLCALC, TRIG, CHOLHDL, LDLDIRECT in the last 72 hours. Thyroid Function Tests: No results for input(s): TSH, T4TOTAL, FREET4, T3FREE, THYROIDAB in the last 72 hours. Anemia Panel: No results for input(s): VITAMINB12, FOLATE, FERRITIN, TIBC, IRON, RETICCTPCT in the last 72 hours. Urine analysis:    Component Value Date/Time   COLORURINE STRAW (A) 05/31/2020 1720   APPEARANCEUR CLEAR 05/31/2020 1720   LABSPEC 1.010 05/31/2020 1720   PHURINE 6.0 05/31/2020 1720   GLUCOSEU NEGATIVE 05/31/2020 1720   HGBUR NEGATIVE 05/31/2020 1720   BILIRUBINUR NEGATIVE 05/31/2020 1720   KETONESUR NEGATIVE 05/31/2020 1720   PROTEINUR NEGATIVE 05/31/2020 1720   NITRITE NEGATIVE 05/31/2020 1720   LEUKOCYTESUR NEGATIVE 05/31/2020 1720    Radiological Exams on Admission: DG Chest 2 View  Result Date: 05/31/2020 CLINICAL DATA:  Altered mental status. EXAM: CHEST - 2 VIEW COMPARISON:  None. FINDINGS: There is no evidence of acute infiltrate, pleural effusion or pneumothorax. The heart size and mediastinal contours are within normal limits. Extensive postoperative changes are seen within the lower cervical spine, mid to lower thoracic spine and visualized portion of the upper lumbar spine. IMPRESSION: No active cardiopulmonary disease. Electronically Signed   By: Aram Candela M.D.   On: 05/31/2020 16:40   CT Head Wo Contrast  Result Date: 05/31/2020 CLINICAL DATA:  Altered mental status. EXAM: CT HEAD WITHOUT CONTRAST TECHNIQUE: Contiguous axial images were obtained from the base of the skull through the vertex without intravenous contrast. COMPARISON:  None. FINDINGS: Brain: There is mild cerebral atrophy with widening of the extra-axial spaces and  ventricular dilatation. There are areas of decreased attenuation within the white matter tracts of the supratentorial brain, consistent with microvascular disease changes. Vascular: No hyperdense vessel or unexpected calcification. Skull: Normal. Negative for fracture or focal lesion. Sinuses/Orbits: No acute finding. Other: None. IMPRESSION: 1. Generalized cerebral atrophy. 2. No acute intracranial abnormality. Electronically Signed   By:  Aram Candela M.D.   On: 05/31/2020 16:39   MR BRAIN WO CONTRAST  Result Date: 05/31/2020 CLINICAL DATA:  Mental status change.  Confusion EXAM: MRI HEAD WITHOUT CONTRAST TECHNIQUE: Multiplanar, multiecho pulse sequences of the brain and surrounding structures were obtained without intravenous contrast. COMPARISON:  CT head 05/31/2020 FINDINGS: Brain: Ventricle size and cerebral volume normal for age. Negative for acute infarct. Mild white matter changes bilaterally. Negative for hemorrhage or mass. Vascular: Normal arterial flow voids Skull and upper cervical spine: No focal skeletal abnormality. Sinuses/Orbits: Mild mucosal edema paranasal sinuses. Bilateral cataract extraction. Other: None IMPRESSION: No acute abnormality. 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 Electronically Signed   By: Marlan Palau M.D.   On: 05/31/2020 19:34      Assessment/Plan  Transient memory loss -Neurology has evaluated and is concerned for seizure versus transient global amnesia. - Recommended lab work ordered TSH, A1c, RPR, HIV, B12, B1.  Goal of B12 greater than 400 -Routine EEG.  Neurology recommends following up EEG prior to initiation of antiseizure medication  Prediabetes - Repeat hemoglobin A1c of 6.2  Hypertension -Continue losartan-HCTZ  Right foot drop - Patient has extensive spinal surgery and is being followed outpatient with planned CT myelogram - Adderall and Zoloft recommended to be held before myelogram  Peripheral  neuropathy - Continue gabapentin  DVT prophylaxis:.Lovenox Code Status: Full Family Communication: Plan discussed with patient at bedside  disposition Plan: Home with observation Consults called:  Admission status: Observation  Level of care: Med-Surg  Status is: Observation  The patient remains OBS appropriate and will d/c before 2 midnights.  Dispo: The patient is from: Home              Anticipated d/c is to: Home              Patient currently is not medically stable to d/c.   Difficult to place patient No         Anselm Jungling DO [facility]   If 7PM-7AM, please contact night-coverage [email or web address]   05/31/2020, 10:45 PM

## 2020-05-31 NOTE — ED Notes (Signed)
Pt for transfer to [facility] ER for MRI, Dr Criss Alvine accepting

## 2020-05-31 NOTE — Discharge Instructions (Addendum)
You have been seen and discharged from the emergency department.  You were seen by the neurology team and evaluated.  Follow-up with your primary provider in 1 week for reevaluation and further care. Take home medications as prescribed. If you have any worsening symptoms or further concerns for your health please return to an emergency department for further evaluation.

## 2020-05-31 NOTE — ED Provider Notes (Signed)
[facility] EMERGENCY DEPARTMENT Provider Note   CSN: [id number] Arrival date & time: 05/31/20  1522     History Chief Complaint  Patient presents with  . Altered Mental Status    Gabriel Simpson is a 75 y.o. male presenting for evaluation of altered mental status/memory loss.  History provided mostly by patient's wife.  She states that patient was in good health this morning.  Going about normal routine.  She left the house for several hours, and she came back, patient was standing in the kitchen, confused as to why he was there.  He did not know was going on.  He could not remember any events from this morning.  He kept telling his wife that "something is wrong."  He currently states he is feeling well.  He denies headache, dizziness, lightheadedness, chest pain, shortness of breath, nausea, vomiting, Donnell pain, urinary symptoms, normal bowel movements.  He was working in the yard yesterday, and wife reports decreased p.o. intake.  He has been eating and drinking well today without difficulty.  Patient still cannot remember what happened today, however he does remember mowing the lawn and events from yesterday.  Patient did trip and fall last night, landing on his forearms.  He did not hit his head or lose consciousness.  He is not on blood thinners.  He has a history of frequent falls due to imbalance and issues with right foot drop.  HPI     Past Medical History:  Diagnosis Date  . Allergy   . Arthritis   . Asthma   . Hyperlipidemia   . Hypertension     Patient Active Problem List   Diagnosis Date Noted  . Anxiety 06/27/2019  . Attention deficit hyperactivity disorder (ADHD), predominantly inattentive type 06/27/2019    Past Surgical History:  Procedure Laterality Date  . thumb surgery Left   . TONSILLECTOMY  1955       Family History  Problem Relation Age of Onset  . Arthritis Mother   . Cancer Mother   . Hearing loss Mother   . Alcohol abuse  Father   . Asthma Father   . COPD Father   . Arthritis Daughter   . Miscarriages / [place] Daughter   . Asthma Son   . Birth defects Son     Social History   Tobacco Use  . Smoking status: Never Smoker  . Smokeless tobacco: Never Used  Vaping Use  . Vaping Use: Never used  Substance Use Topics  . Alcohol use: No  . Drug use: No    Home Medications Prior to Admission medications   Medication Sig Start Date End Date Taking? Authorizing Provider  albuterol (PROVENTIL HFA;VENTOLIN HFA) 108 (90 Base) MCG/ACT inhaler Inhale 1-2 puffs into the lungs every 6 (six) hours as needed for wheezing or shortness of breath. 01/04/17   Domenick Gong, MD  amphetamine-dextroamphetamine (ADDERALL XR) 20 MG 24 hr capsule Take 1 capsule (20 mg total) by mouth daily. Fill today. 06/27/19 07/27/19  Mliss Sax, MD  amphetamine-dextroamphetamine (ADDERALL XR) 20 MG 24 hr capsule Take 1 capsule (20 mg total) by mouth every morning. 06/27/19   Mliss Sax, MD  amphetamine-dextroamphetamine (ADDERALL XR) 20 MG 24 hr capsule Take 1 capsule (20 mg total) by mouth every morning. 06/27/19   Mliss Sax, MD  aspirin 81 MG chewable tablet Chew 81 mg by mouth daily.    [provider]  atorvastatin (LIPITOR) 20 MG tablet Take 20 mg  by mouth daily.    [provider]  cetirizine (ZYRTEC) 5 MG tablet Take by mouth.    [provider]  finasteride (PROSCAR) 5 MG tablet Take 5 mg by mouth daily.    [provider]  gabapentin (NEURONTIN) 100 MG capsule Take 100 mg by mouth at bedtime.    [provider]  ipratropium (ATROVENT) 0.06 % nasal spray Place 2 sprays into both nostrils 4 (four) times daily. 3-4 times/ day Patient not taking: Reported on 06/27/2019 01/04/17   Domenick Gong, MD  losartan-hydrochlorothiazide [facility]) 100-12.5 MG tablet Take 1 tablet by mouth daily.    [provider]  meloxicam (MOBIC) 7.5 MG tablet  Take by mouth. 04/22/19   [provider]  metFORMIN (GLUCOPHAGE) 500 MG tablet Take by mouth. 04/22/19   [provider]  Multiple Vitamin (MULTIVITAMIN) tablet Take 1 tablet by mouth daily.    [provider]  sertraline (ZOLOFT) 50 MG tablet Take 1 tablet (50 mg total) by mouth daily. 06/27/19   Mliss Sax, MD  Spacer/Aero-Holding Chambers (AEROCHAMBER PLUS) inhaler Use as instructed 01/04/17   Domenick Gong, MD  tamsulosin (FLOMAX) 0.4 MG CAPS capsule Take 0.4 mg by mouth daily.    [provider]    Allergies    Patient has no known allergies.  Review of Systems   Review of Systems  Neurological:       Memory loss  Psychiatric/Behavioral: Positive for confusion.  All other systems reviewed and are negative.   Physical Exam Updated Vital Signs BP (!) 143/91   Pulse 81   Temp 98.3 F (36.8 C) (Oral)   Resp 16   Ht 6' (1.829 m)   Wt 84 kg   SpO2 95%   BMI 25.12 kg/m   Physical Exam Vitals and nursing note reviewed.  Constitutional:      General: He is not in acute distress.    Appearance: He is well-developed.     Comments: Resting in the bed in NAD  HENT:     Head: Normocephalic and atraumatic.  Eyes:     Extraocular Movements: Extraocular movements intact.     Conjunctiva/sclera: Conjunctivae normal.     Pupils: Pupils are equal, round, and reactive to light.  Cardiovascular:     Rate and Rhythm: Normal rate and regular rhythm.     Pulses: Normal pulses.  Pulmonary:     Effort: Pulmonary effort is normal. No respiratory distress.     Breath sounds: Normal breath sounds. No wheezing.  Abdominal:     General: There is no distension.     Palpations: Abdomen is soft. There is no mass.     Tenderness: There is no abdominal tenderness. There is no guarding or rebound.  Musculoskeletal:        General: Normal range of motion.     Cervical back: Normal range of motion and neck supple.  Skin:    General: Skin is  warm and dry.     Capillary Refill: Capillary refill takes less than 2 seconds.  Neurological:     Mental Status: He is alert and oriented to person, place, and time.     GCS: GCS eye subscore is 4. GCS verbal subscore is 5. GCS motor subscore is 6.     Cranial Nerves: Cranial nerves are intact.     Sensory: Sensation is intact.     Coordination: Coordination is intact.     Comments: No acute neurologic deficits noted.  CN  intact.  Nose to finger intact.  Grip strength equal bilaterally.  Sensation of upper and lower extremities equal bilaterally.  Slight weakness of the right foot with dorsiflexion, patient has a history of right foot drop of the right foot.  Normal plantarflexion.  Slight atrophy of the muscles of the right lower leg.  Patient able to lift legs off the bed with equal strength. visal fields grossly intact.      ED Results / Procedures / Treatments   Labs (all labs ordered are listed, but only abnormal results are displayed) Labs Reviewed  COMPREHENSIVE METABOLIC PANEL - Abnormal; Notable for the following components:      Result Value   Glucose, Bld 110 (*)    All other components within normal limits  CBC WITH DIFFERENTIAL/PLATELET  URINALYSIS, ROUTINE W REFLEX MICROSCOPIC    EKG EKG Interpretation  Date/Time:  Sunday May 31 2020 15:49:00 EDT Ventricular Rate:  90 PR Interval:  149 QRS Duration: 99 QT Interval:  349 QTC Calculation: 427 R Axis:   95 Text Interpretation: Sinus rhythm Right axis deviation No old tracing to compare Confirmed by Melene PlanFloyd, Dan [id number]) on 05/31/2020 4:32:25 PM   Radiology DG Chest 2 View  Result Date: 05/31/2020 CLINICAL DATA:  Altered mental status. EXAM: CHEST - 2 VIEW COMPARISON:  None. FINDINGS: There is no evidence of acute infiltrate, pleural effusion or pneumothorax. The heart size and mediastinal contours are within normal limits. Extensive postoperative changes are seen within the lower cervical spine, mid to lower thoracic  spine and visualized portion of the upper lumbar spine. IMPRESSION: No active cardiopulmonary disease. Electronically Signed   By: Aram Candelahaddeus  Houston M.D.   On: 05/31/2020 16:40   CT Head Wo Contrast  Result Date: 05/31/2020 CLINICAL DATA:  Altered mental status. EXAM: CT HEAD WITHOUT CONTRAST TECHNIQUE: Contiguous axial images were obtained from the base of the skull through the vertex without intravenous contrast. COMPARISON:  None. FINDINGS: Brain: There is mild cerebral atrophy with widening of the extra-axial spaces and ventricular dilatation. There are areas of decreased attenuation within the white matter tracts of the supratentorial brain, consistent with microvascular disease changes. Vascular: No hyperdense vessel or unexpected calcification. Skull: Normal. Negative for fracture or focal lesion. Sinuses/Orbits: No acute finding. Other: None. IMPRESSION: 1. Generalized cerebral atrophy. 2. No acute intracranial abnormality. Electronically Signed   By: Aram Candelahaddeus  Houston M.D.   On: 05/31/2020 16:39    Procedures Procedures   Medications Ordered in ED Medications - No data to display  ED Course  I have reviewed the triage vital signs and the nursing notes.  Pertinent labs & imaging results that were available during my care of the patient were reviewed by me and considered in my medical decision making (see chart for details).    MDM Rules/Calculators/A&P                          Patient presenting for evaluation of memory loss acutely this morning.  On exam, patient appears nontoxic.  No gross neurologic deficits.  Patient is able to answer orientation questions appropriately, however still does not remember anything he did this morning.  He does remember events from yesterday.  Neuro exam shows baseline weakness of the right foot due to foot drop, otherwise no abnormalities.  Will obtain labs to look for metabolic abnormality.  CT head to rule out bleed.  Urine and chest x-ray to rule  out infection. Consider cva vs tia vs  transient global amnesia.   Labs interpreted by me, overall reassuring.  CT head and chest x-ray negative for acute findings.  Discussed with Dr. Wilford Corner from neurology, who recommends MRI.  Unfortunately this is not available at this facility at this time, as such, patient be transferred to [facility] for MRI.  Discussed findings and plan with patient, wife, and daughter, who are agreeable.  Patient to go POV.  MRI has been ordered.  Discussed with Dr. Criss Alvine from the [facility], he accepts pt for transfer.   Final Clinical Impression(s) / ED Diagnoses Final diagnoses:  Altered mental status, unspecified altered mental status type    Rx / DC Orders ED Discharge Orders    None       Alveria Apley, PA-C 05/31/20 1710    Melene Plan, DO 05/31/20 1728

## 2020-05-31 NOTE — Plan of Care (Signed)
Called for ?TGA from med [employer]. Back to baseline. No lab abnormalities noted.  Recomment at least a MRI w/o for which being transferred to [facility].  Please call the on-call neuro hospitalist for questions after imaging is obtained.  -- Milon Dikes, MD Neurologist [facility] Pager: [phone]

## 2020-05-31 NOTE — ED Provider Notes (Signed)
8:54 PM MRI with no acute stroke. Seems to be back to normal now. Completely oriented. Can't remember what happened earlier today though. D/w Dr. Iver Nestle, who wants to come assess prior to deciding about admission/discharge   Neurology recommends admission for EEG and monitoring.  Dr. Cyndia Bent to admit.   Pricilla Loveless, MD 05/31/20 [phone]

## 2020-05-31 NOTE — ED Notes (Signed)
Report called to Asher Muir RN charge to notify of transfer to [facility] ED for MRI.  Dr Adela Lank approved transport via private vehicle with wife and daughter.  IV site to remain intact.

## 2020-06-01 ENCOUNTER — Observation Stay (HOSPITAL_COMMUNITY): Payer: Medicare PPO

## 2020-06-01 DIAGNOSIS — R410 Disorientation, unspecified: Secondary | ICD-10-CM

## 2020-06-01 DIAGNOSIS — I10 Essential (primary) hypertension: Secondary | ICD-10-CM | POA: Diagnosis not present

## 2020-06-01 DIAGNOSIS — R4182 Altered mental status, unspecified: Secondary | ICD-10-CM | POA: Diagnosis not present

## 2020-06-01 LAB — RAPID URINE DRUG SCREEN, HOSP PERFORMED
Amphetamines: POSITIVE — AB
Barbiturates: NOT DETECTED
Benzodiazepines: NOT DETECTED
Cocaine: NOT DETECTED
Opiates: NOT DETECTED
Tetrahydrocannabinol: NOT DETECTED

## 2020-06-01 LAB — BASIC METABOLIC PANEL
Anion gap: 6 (ref 5–15)
BUN: 15 mg/dL (ref 8–23)
CO2: 27 mmol/L (ref 22–32)
Calcium: 9 mg/dL (ref 8.9–10.3)
Chloride: 105 mmol/L (ref 98–111)
Creatinine, Ser: 1.04 mg/dL (ref 0.61–1.24)
GFR, Estimated: >60 mL/min (ref >60)
Glucose, Bld: 101 mg/dL — ABNORMAL HIGH (ref 70–99)
Potassium: 3.4 mmol/L — ABNORMAL LOW (ref 3.5–5.1)
Sodium: 138 mmol/L (ref 135–145)

## 2020-06-01 LAB — CBC
HCT: 45.2 % (ref 39.0–52.0)
Hemoglobin: 15.1 g/dL (ref 13.0–17.0)
MCH: 31.5 pg (ref 26.0–34.0)
MCHC: 33.4 g/dL (ref 30.0–36.0)
MCV: 94.4 fL (ref 80.0–100.0)
Platelets: 241 10*3/uL (ref 150–400)
RBC: 4.79 MIL/uL (ref 4.22–5.81)
RDW: 13.1 % (ref 11.5–15.5)
WBC: 5.5 10*3/uL (ref 4.0–10.5)
nRBC: 0 % (ref 0.0–0.2)

## 2020-06-01 LAB — TSH: TSH: 2.428 u[IU]/mL (ref 0.350–4.500)

## 2020-06-01 LAB — RPR: RPR Ser Ql: NONREACTIVE

## 2020-06-01 LAB — CBG MONITORING, ED
Glucose-Capillary: 159 mg/dL — ABNORMAL HIGH (ref 70–99)
Glucose-Capillary: 123 mg/dL — ABNORMAL HIGH (ref 70–99)
Glucose-Capillary: 73 mg/dL (ref 70–99)

## 2020-06-01 MED ORDER — POTASSIUM CHLORIDE CRYS ER 20 MEQ PO TBCR
40.0000 meq | EXTENDED_RELEASE_TABLET | Freq: Once | ORAL | Status: AC
  Administered 2020-06-01: 40 meq via ORAL
  Filled 2020-06-01: qty 2

## 2020-06-01 NOTE — Progress Notes (Signed)
EEG Completed; Results Pending  

## 2020-06-01 NOTE — ED Notes (Signed)
Pt's wife is requesting for pt to go home and return in the am for testing. Dr. Cyndia Bent notified of request and is at bedside at this time speaking with pt.

## 2020-06-01 NOTE — Procedures (Signed)
Patient Name: Gabriel Simpson  MRN: [id number]  Epilepsy Attending: Charlsie Quest  Referring Physician/Provider: Dr Brooke Dare Date: 06/01/2020 Duration: 23.55 mins  Patient history: 75 year old male with episode of transient confusion with memory loss. EEG to evaluate for seizures.  Level of alertness: Awake, asleep  AEDs during EEG study: Gabapentin  Technical aspects: This EEG study was done with scalp electrodes positioned according to the 10-20 International system of electrode placement. Electrical activity was acquired at a sampling rate of 500Hz  and reviewed with a high frequency filter of 70Hz  and a low frequency filter of 1Hz . EEG data were recorded continuously and digitally stored.   Description: The posterior dominant rhythm consists of 9-10 Hz activity of moderate voltage (25-35 uV) seen predominantly in posterior head regions, symmetric and reactive to eye opening and eye closing. Sleep was characterized by vertex waves, sleep spindles (12 to 14 Hz), maximal frontocentral region. Physiologic photic driving was not seen during photic stimulation.  Hyperventilation was not performed.     IMPRESSION: This study is within normal limits. No seizures or epileptiform discharges were seen throughout the recording.  Tae Vonada 

## 2020-06-01 NOTE — Progress Notes (Addendum)
Neurology Progress Note  S: Patient seen by neurology last p.m. due to TGA. Per patient, he feels back to his normal self, but does not recall everything from yesterday. He still does not recall what happened at home, but now remembers coming to the hospital. He remains in the ED awaiting hospital bed as he was technically admitted by hospitalist last p.m. However, we do not feel patient needs to stay here unless EEG is positive. He wants to go home.   O: Current vital signs: BP 122/85 (BP Location: Right Arm)   Pulse 65   Temp 97.8 F (36.6 C) (Oral)   Resp 15   Ht 6' (1.829 m)   Wt 84 kg   SpO2 97%   BMI 25.12 kg/m  Vital signs in last 24 hours: Temp:  [97.5 F (36.4 C)-98.8 F (37.1 C)] 97.8 F (36.6 C) (05/23 0947) Pulse Rate:  [53-98] 65 (05/23 0947) Resp:  [10-23] 15 (05/23 0947) BP: (99-153)/(62-101) 122/85 (05/23 0947) SpO2:  [95 %-99 %] 97 % (05/23 0947) Weight:  [84 kg] 84 kg (05/22 1545)  GENERAL: Awake, alert in NAD. Appears very well. Eating breakfast sitting on side of gurney.  HEENT: Normocephalic and atraumatic LUNGS: Normal respiratory effort.  CV: RRR on tele.  Ext: warm  NEURO:  Mental Status: AA&Ox3  Speech/Language: speech is without aphasia or dysarthria.  Naming, repetition, fluency, and comprehension intact.  Cranial Nerves:  II: PERRL. Visual fields full.  III, IV, VI: EOMI. Eyelids elevate symmetrically.  V: Sensation is intact to light touch and symmetrical to face.  VII: Smile is symmetrical. Able to puff cheeks and raise eyebrows.  VIII: hearing intact to voice. IX, X: Palate elevates symmetrically. Phonation is normal.  XII: tongue is midline without fasciculations. Motor: 5/5 strength to all muscle groups tested.  Tone: is normal and bulk is normal Sensation- Intact to light touch bilaterally. Extinction absent to light touch to DSS.    Coordination: FTN intact bilaterally. Gait- deferred.  Medications  Current Facility-Administered  Medications:  .  aspirin chewable tablet 81 mg, 81 mg, Oral, Daily, Tu, Ching T, DO .  atorvastatin (LIPITOR) tablet 20 mg, 20 mg, Oral, Daily, Tu, Ching T, DO, 20 mg at 06/01/20 0945 .  enoxaparin (LOVENOX) injection 40 mg, 40 mg, Subcutaneous, Daily, Tu, Ching T, DO .  finasteride (PROSCAR) tablet 5 mg, 5 mg, Oral, Daily, Tu, Ching T, DO, 5 mg at 06/01/20 0945 .  gabapentin (NEURONTIN) capsule 200 mg, 200 mg, Oral, QHS, Tu, Ching T, DO, 200 mg at 06/01/20 0028 .  losartan (COZAAR) tablet 100 mg, 100 mg, Oral, Daily, 100 mg at 06/01/20 0945 **AND** hydrochlorothiazide (HYDRODIURIL) tablet 25 mg, 25 mg, Oral, Daily, Tu, Ching T, DO, 25 mg at 06/01/20 0945 .  insulin aspart (novoLOG) injection 0-9 Units, 0-9 Units, Subcutaneous, TID PC,HS,0200, Tu, Ching T, DO, 1 Units at 06/01/20 0945 .  potassium chloride SA (KLOR-CON) CR tablet 40 mEq, 40 mEq, Oral, Once, [place], Gagan S, MD .  tamsulosin [facility]) capsule 0.4 mg, 0.4 mg, Oral, Daily, Tu, Ching T, DO, 0.4 mg at 06/01/20 0945  Current Outpatient Medications:  .  amphetamine-dextroamphetamine (ADDERALL XR) 20 MG 24 hr capsule, Take 1 capsule (20 mg total) by mouth every morning., Disp: 30 capsule, Rfl: 0 .  aspirin 81 MG chewable tablet, Chew 81 mg by mouth daily., Disp: , Rfl:  .  atorvastatin (LIPITOR) 20 MG tablet, Take 20 mg by mouth daily., Disp: , Rfl:  .  cetirizine (ZYRTEC) 5 MG tablet, Take 5 mg by mouth daily., Disp: , Rfl:  .  finasteride (PROSCAR) 5 MG tablet, Take 5 mg by mouth daily., Disp: , Rfl:  .  gabapentin (NEURONTIN) 100 MG capsule, Take 100 mg by mouth at bedtime., Disp: , Rfl:  .  glucosamine-chondroitin 500-400 MG tablet, Take 1 tablet by mouth 3 (three) times daily., Disp: , Rfl:  .  losartan-hydrochlorothiazide (HYZAAR) 100-12.5 MG tablet, Take 1 tablet by mouth daily., Disp: , Rfl:  .  meloxicam (MOBIC) 7.5 MG tablet, Take 7.5 mg by mouth daily., Disp: , Rfl:  .  metFORMIN (GLUCOPHAGE) 500 MG tablet, Take 500 mg by mouth  2 (two) times daily with a meal., Disp: , Rfl:  .  Multiple Vitamin (MULTIVITAMIN) tablet, Take 1 tablet by mouth daily., Disp: , Rfl:  .  Nutritional Supplements (A/G PRO) TABS, Take 3 tablets by mouth daily., Disp: , Rfl:  .  sertraline (ZOLOFT) 50 MG tablet, Take 1 tablet (50 mg total) by mouth daily., Disp: 90 tablet, Rfl: 1 .  Spacer/Aero-Holding Chambers (AEROCHAMBER PLUS) inhaler, Use as instructed (Patient taking differently: 1 each by Other route as directed.), Disp: 1 each, Rfl: 2 .  tamsulosin (FLOMAX) 0.4 MG CAPS capsule, Take 0.4 mg by mouth daily., Disp: , Rfl:  .  albuterol (PROVENTIL HFA;VENTOLIN HFA) 108 (90 Base) MCG/ACT inhaler, Inhale 1-2 puffs into the lungs every 6 (six) hours as needed for wheezing or shortness of breath. (Patient not taking: Reported on 06/01/2020), Disp: 1 Inhaler, Rfl: 0 .  amphetamine-dextroamphetamine (ADDERALL XR) 20 MG 24 hr capsule, Take 1 capsule (20 mg total) by mouth every morning. (Patient not taking: No sig reported), Disp: 30 capsule, Rfl: 0 .  ipratropium (ATROVENT) 0.06 % nasal spray, Place 2 sprays into both nostrils 4 (four) times daily. 3-4 times/ day (Patient not taking: Reported on 06/01/2020), Disp: 15 mL, Rfl: 0  Pertinent Labs B12 746.  Glucose 101.  TSH 2.428.   HIV non reactive.   UDS positive for amphetamines, but patient takes Adderall.   Imaging MD has reviewed images in epic and the results pertinent to this consultation are:  CT Head Generalized cerebral atrophy. No acute intracranial abnormality.  MRI Brain  No acute abnormality. 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  EEG 06/01/20.  This study is within normal limits. No seizures or epileptiform discharges were seen throughout the recording.  Pt seen by Jimmye Norman, MSN, APN-BC/Nurse Practitioner/Neuro  Pager: [phone]   Assessment: 75 yo male who presented to ED 05/31/20 with acute amnestic episode most consistent with  transient global amnesia. With negative MRI and EEG, other etiologies are much less likely. At this point, no further workup is needed.    Recommendations:  - Return to ED for recurrent symptoms of confusion or for any seizure-like activity.  - No specific neurology follow up needed at this time. If he were to have further episodes, would favor further workup at that time.   Ritta Slot, MD [facility] [phone]  If 7pm- 7am, please page neurology on call as listed in AMION.

## 2020-06-01 NOTE — ED Notes (Signed)
EEG in process to bedside.

## 2020-06-01 NOTE — Discharge Summary (Signed)
Physician Discharge Summary  Gabriel Simpson OJJ:[id number] DOB: 07/18/1945 DOA: 05/31/2020  PCP: Mick Sell, MD  Admit date: 05/31/2020 Discharge date: 06/01/2020  Time spent: 50 minutes  Recommendations for Outpatient Follow-up:  1. Follow-up PCP in 1 week 2. Follow vitamin B1 level as outpatient   Discharge Diagnoses:  Principal Problem:   AMS (altered mental status) Active Problems:   Pre-diabetes   HTN (hypertension)   Right foot drop   Peripheral neuropathy   Discharge Condition: Stable  Diet recommendation: Carb modified diet  Filed Weights   05/31/20 1545  Weight: 84 kg    History of present illness:  75 year old male with history of hypertension, prediabetes, peripheral neuropathy, recent right foot drop, anxiety, hyperlipidemia who presented with memory loss.  Patient had episode where he had breakfast with family, after few hours when wife returned home patient was standing over the breakfast counter and asked him what is going on.  And he kept repeating I do not know and felt something was wrong.  Patient was seen at [facility] and then transferred to [facility] for further work-up.    Hospital Course:   Transient memory loss-resolved.  He is back to baseline.  CT head and MRI brain were unremarkable.  He also had EEG which did not show any seizure-like activity.  Neurology has seen the patient and recommend to discharge patient home.  All the lab work is so far negative.  Vitamin B12 746, TSH 1.659, hemoglobin A1c 6.2.  Vitamin B1 level is obtained and is currently pending.  Vitamin B-1 level can be followed up as outpatient.  Prediabetes-hemoglobin A1c 6.2.  Continue metformin 500 mg p.o. twice daily.  Hypertension-blood pressures controlled, continue losartan HCTZ.  Hypokalemia-potassium is 3.4.  Will give 1 dose of K-Lor 40 mg p.o. x1.  Peripheral neuropathy-continue gabapentin.  Right foot drop-patient had extensive spinal injury and is for outpatient with  planned CT myelogram.    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  We will continue taking these medications.    Procedures:    Consultations:  Neurology  Discharge Exam: Vitals:   06/01/20 0715 06/01/20 0947  BP: 106/66 122/85  Pulse: (!) 53 65  Resp: 16 15  Temp:  97.8 F (36.6 C)  SpO2: 99% 97%    General: Appears in no acute distress Cardiovascular: S1-S2, regular Respiratory: Clear to auscultation bilaterally  Discharge Instructions   Discharge Instructions    Diet - low sodium heart healthy   Complete by: As directed    Increase activity slowly   Complete by: As directed      Allergies as of 06/01/2020      Reactions   Codeine Itching      Medication List    TAKE these medications   A/G Pro Tabs Take 3 tablets by mouth daily.   AeroChamber Plus inhaler Use as instructed What changed:   how much to take  how to take this  when to take this  additional instructions   albuterol 108 (90 Base) MCG/ACT inhaler Commonly known as: VENTOLIN HFA Inhale 1-2 puffs into the lungs every 6 (six) hours as needed for wheezing or shortness of breath.   amphetamine-dextroamphetamine 20 MG 24 hr capsule Commonly known as: ADDERALL XR Take 1 capsule (20 mg total) by mouth every morning. What changed: Another medication with the same name was removed. Continue taking this medication, and follow the directions you see here.   aspirin 81 MG chewable tablet  Chew 81 mg by mouth daily.   atorvastatin 20 MG tablet Commonly known as: LIPITOR Take 20 mg by mouth daily.   cetirizine 5 MG tablet Commonly known as: ZYRTEC Take 5 mg by mouth daily.   finasteride 5 MG tablet Commonly known as: PROSCAR Take 5 mg by mouth daily.   gabapentin 100 MG capsule Commonly known as: NEURONTIN Take 100 mg by mouth at bedtime.   glucosamine-chondroitin 500-400 MG tablet Take 1 tablet by mouth 3 (three) times daily.   ipratropium  0.06 % nasal spray Commonly known as: Atrovent Place 2 sprays into both nostrils 4 (four) times daily. 3-4 times/ day   losartan-hydrochlorothiazide 100-12.5 MG tablet Commonly known as: HYZAAR Take 1 tablet by mouth daily.   meloxicam 7.5 MG tablet Commonly known as: MOBIC Take 7.5 mg by mouth daily.   metFORMIN 500 MG tablet Commonly known as: GLUCOPHAGE Take 500 mg by mouth 2 (two) times daily with a meal.   multivitamin tablet Take 1 tablet by mouth daily.   sertraline 50 MG tablet Commonly known as: ZOLOFT Take 1 tablet (50 mg total) by mouth daily.   tamsulosin 0.4 MG Caps capsule Commonly known as: FLOMAX Take 0.4 mg by mouth daily.      Allergies  Allergen Reactions  . Codeine Itching    Follow-up Information    Go to  [facility].   Contact information: [address] [place] [address] [phone]               The results of significant diagnostics from this hospitalization (including imaging, microbiology, ancillary and laboratory) are listed below for reference.    Significant Diagnostic Studies: DG Chest 2 View  Result Date: 05/31/2020 CLINICAL DATA:  Altered mental status. EXAM: CHEST - 2 VIEW COMPARISON:  None. FINDINGS: There is no evidence of acute infiltrate, pleural effusion or pneumothorax. The heart size and mediastinal contours are within normal limits. Extensive postoperative changes are seen within the lower cervical spine, mid to lower thoracic spine and visualized portion of the upper lumbar spine. IMPRESSION: No active cardiopulmonary disease. Electronically Signed   By: Aram Candela M.D.   On: 05/31/2020 16:40   CT Head Wo Contrast  Result Date: 05/31/2020 CLINICAL DATA:  Altered mental status. EXAM: CT HEAD WITHOUT CONTRAST TECHNIQUE: Contiguous axial images were obtained from the base of the skull through the vertex without intravenous contrast. COMPARISON:  None. FINDINGS: Brain: There  is mild cerebral atrophy with widening of the extra-axial spaces and ventricular dilatation. There are areas of decreased attenuation within the white matter tracts of the supratentorial brain, consistent with microvascular disease changes. Vascular: No hyperdense vessel or unexpected calcification. Skull: Normal. Negative for fracture or focal lesion. Sinuses/Orbits: No acute finding. Other: None. IMPRESSION: 1. Generalized cerebral atrophy. 2. No acute intracranial abnormality. Electronically Signed   By: Aram Candela M.D.   On: 05/31/2020 16:39   MR BRAIN WO CONTRAST  Result Date: 05/31/2020 CLINICAL DATA:  Mental status change.  Confusion EXAM: MRI HEAD WITHOUT CONTRAST TECHNIQUE: Multiplanar, multiecho pulse sequences of the brain and surrounding structures were obtained without intravenous contrast. COMPARISON:  CT head 05/31/2020 FINDINGS: Brain: Ventricle size and cerebral volume normal for age. Negative for acute infarct. Mild white matter changes bilaterally. Negative for hemorrhage or mass. Vascular: Normal arterial flow voids Skull and upper cervical spine: No focal skeletal abnormality. Sinuses/Orbits: Mild mucosal edema paranasal sinuses. Bilateral cataract extraction. Other: None IMPRESSION: No acute abnormality. Mild white  matter changes consistent with chronic microvascular ischemia. Electronically Signed   By: Marlan Palauharles  Clark M.D.   On: 05/31/2020 19:34   MR SHOULDER RIGHT WO CONTRAST  Result Date: 05/19/2020 CLINICAL DATA:  Chronic right arm pain.  No injury or prior surgery. EXAM: MRI OF THE RIGHT SHOULDER WITHOUT CONTRAST TECHNIQUE: Multiplanar, multisequence MR imaging of the shoulder was performed. No intravenous contrast was administered. COMPARISON:  None. FINDINGS: Rotator cuff: Intact rotator cuff. Moderate supraspinatus, infraspinatus, and subscapularis tendinosis. Muscles: No atrophy or abnormal signal of the muscles of the rotator cuff. Biceps long head:  Intact and normally  positioned. Acromioclavicular Joint: Mild arthropathy of the acromioclavicular joint. Type I acromion. Small amount of fluid in the subacromial/subdeltoid bursa. Glenohumeral Joint: Extensive full-thickness cartilage loss over the glenoid and humeral head with marginal osteophytes. Small joint effusion. Labrum: Diffusely degenerated. Degenerative tearing of the posterosuperior labrum. Bones: No acute fracture or dislocation. No suspicious bone lesion. Other: None. IMPRESSION: 1. Intact rotator cuff with moderate tendinosis. 2. 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 3. Mild subacromial/subdeltoid bursitis. Electronically Signed   By: Obie DredgeWilliam T Derry M.D.   On: 05/19/2020 12:14   EEG adult  Result Date: 06/01/2020 Charlsie QuestYadav, Priyanka O, MD     06/01/2020  9:40 AM Patient Name: Peterson AoClarence Delker MRN: [id number] Epilepsy Attending: Charlsie QuestPriyanka O Yadav Referring Physician/Provider: Dr Brooke DareSrishti Bhagat Date: 06/01/2020 Duration: 23.55 mins Patient history: 75 year old male with episode of transient confusion with memory loss. EEG to evaluate for seizures. Level of alertness: Awake, asleep AEDs during EEG study: Gabapentin Technical aspects: This EEG study was done with scalp electrodes positioned according to the 10-20 International system of electrode placement. Electrical activity was acquired at a sampling rate of 500Hz  and reviewed with a high frequency filter of 70Hz  and a low frequency filter of 1Hz . EEG data were recorded continuously and digitally stored. Description: The posterior dominant rhythm consists of 9-10 Hz activity of moderate voltage (25-35 uV) seen predominantly in posterior head regions, symmetric and reactive to eye opening and eye closing. Sleep was characterized by vertex waves, sleep spindles (12 to 14 Hz), maximal frontocentral region. Physiologic photic driving was not seen during photic stimulation.  Hyperventilation was not performed.   IMPRESSION: This study is within normal  limits. No seizures or epileptiform discharges were seen throughout the recording. Charlsie QuestPriyanka O Yadav    Microbiology: Recent Results (from the past 240 hour(s))  Resp Panel by RT-PCR (Flu A&B, Covid) Nasopharyngeal Swab     Status: None   Collection Time: 05/31/20 10:02 PM   Specimen: Nasopharyngeal Swab; Nasopharyngeal(NP) swabs in vial transport medium  Result Value Ref Range Status   SARS Coronavirus 2 by RT PCR NEGATIVE NEGATIVE Final    Comment: (NOTE) SARS-CoV-2 target nucleic acids are NOT DETECTED.  The SARS-CoV-2 RNA is generally detectable in upper respiratory specimens during the acute phase of infection. The lowest concentration of SARS-CoV-2 viral copies this assay can detect is 138 copies/mL. A negative result does not preclude SARS-Cov-2 infection and should not be used as the sole basis for treatment or other patient management decisions. A negative result may occur with  improper specimen collection/handling, submission of specimen other than nasopharyngeal swab, presence of viral mutation(s) within the areas targeted by this assay, and inadequate number of viral copies(<138 copies/mL). A negative result must be combined with clinical observations, patient history, and epidemiological information. The expected result is Negative.  Fact Sheet for Patients:  BloggerCourse.comhttps://www.fda.gov/media/152166/download  Fact Sheet for Healthcare Providers:  SeriousBroker.ithttps://www.fda.gov/media/152162/download  This  test is no t yet approved or cleared by the [place] and  has been authorized for detection and/or diagnosis of SARS-CoV-2 by FDA under an Emergency Use Authorization (EUA). This EUA will remain  in effect (meaning this test can be used) for the duration of the COVID-19 declaration under Section 564(b)(1) of the Act, 21 U.S.C.section 360bbb-3(b)(1), unless the authorization is terminated  or revoked sooner.       Influenza A by PCR NEGATIVE NEGATIVE Final   Influenza B  by PCR NEGATIVE NEGATIVE Final    Comment: (NOTE) The Xpert Xpress SARS-CoV-2/FLU/RSV plus assay is intended as an aid in the diagnosis of influenza from Nasopharyngeal swab specimens and should not be used as a sole basis for treatment. Nasal washings and aspirates are unacceptable for Xpert Xpress SARS-CoV-2/FLU/RSV testing.  Fact Sheet for Patients: BloggerCourse.com  Fact Sheet for Healthcare Providers: SeriousBroker.it  This test is not yet approved or cleared by the [place] FDA and has been authorized for detection and/or diagnosis of SARS-CoV-2 by FDA under an Emergency Use Authorization (EUA). This EUA will remain in effect (meaning this test can be used) for the duration of the COVID-19 declaration under Section 564(b)(1) of the Act, 21 U.S.C. section 360bbb-3(b)(1), unless the authorization is terminated or revoked.  Performed at [facility], [address], [place], [place] [address]      Labs: Basic Metabolic Panel: Recent Labs  Lab 05/31/20 1557 06/01/20 0341  NA 137 138  K 3.9 3.4*  CL 102 105  CO2 27 27  GLUCOSE 110* 101*  BUN 19 15  CREATININE 1.08 1.04  CALCIUM 9.2 9.0   Liver Function Tests: Recent Labs  Lab 05/31/20 1557  AST 29  ALT 23  ALKPHOS 43  BILITOT 0.7  PROT 7.2  ALBUMIN 4.4   No results for input(s): LIPASE, AMYLASE in the last 168 hours. No results for input(s): AMMONIA in the last 168 hours. CBC: Recent Labs  Lab 05/31/20 1557 06/01/20 0341  WBC 6.1 5.5  NEUTROABS 4.7  --   HGB 16.2 15.1  HCT 48.6 45.2  MCV 94.7 94.4  PLT 283 241    CBG: Recent Labs  Lab 06/01/20 0224 06/01/20 0813  GLUCAP 159* 123*       Signed:  Meredeth Ide MD.  [facility] 06/01/2020, 10:48 AM

## 2020-06-03 ENCOUNTER — Other Ambulatory Visit: Payer: Self-pay

## 2020-06-03 ENCOUNTER — Ambulatory Visit
Admission: RE | Admit: 2020-06-03 | Discharge: 2020-06-03 | Disposition: A | Discharge status: Home / Self Care | Payer: Medicare PPO | Source: Ambulatory Visit | Attending: Nurse Practitioner | Admitting: Nurse Practitioner

## 2020-06-03 ENCOUNTER — Ambulatory Visit: Payer: Medicare PPO

## 2020-06-03 DIAGNOSIS — Z981 Arthrodesis status: Secondary | ICD-10-CM

## 2020-06-03 LAB — GLUCOSE, CAPILLARY: Glucose-Capillary: 95 mg/dL (ref 70–99)

## 2020-06-03 LAB — APTT: aPTT: 31 seconds (ref 24–36)

## 2020-06-03 LAB — BASIC METABOLIC PANEL
Anion gap: 10 (ref 5–15)
BUN: 19 mg/dL (ref 8–23)
CO2: 27 mmol/L (ref 22–32)
Calcium: 9.3 mg/dL (ref 8.9–10.3)
Chloride: 101 mmol/L (ref 98–111)
Creatinine, Ser: 0.89 mg/dL (ref 0.61–1.24)
GFR, Estimated: >60 mL/min (ref >60)
Glucose, Bld: 102 mg/dL — ABNORMAL HIGH (ref 70–99)
Potassium: 4.1 mmol/L (ref 3.5–5.1)
Sodium: 138 mmol/L (ref 135–145)

## 2020-06-03 LAB — PROTIME-INR
INR: 1 (ref 0.8–1.2)
Prothrombin Time: 13.4 seconds (ref 11.4–15.2)

## 2020-06-03 MED ORDER — LIDOCAINE HCL (PF) 1 % IJ SOLN
5.0000 mL | Freq: Once | INTRAMUSCULAR | Status: AC
  Administered 2020-06-03: 5 mL
  Filled 2020-06-03: qty 5

## 2020-06-03 NOTE — Discharge Instructions (Signed)
Myelogram  A myelogram is an imaging test. This test checks for problems in the spinal cord and the places where nerves attach to the spinal cord (nerve roots). A dye (contrast material) is put into your spine before the X-ray. This provides a clearer image for your doctor to see. You may need this test if you have a spinal cord problem that cannot be diagnosed with other imaging tests. You may also have this test to check your spine after surgery. Tell a doctor about:  Any allergies you have, especially to iodine.  All medicines you are taking, including vitamins, herbs, eye drops, creams, and over-the-counter medicines.  Any problems you or family members have had with anesthetic medicines or dye.  Any blood disorders you have.  Any surgeries you have had.  Any medical conditions you have or have had, including asthma.  Whether you are pregnant or may be pregnant. What are the risks? Generally, this is a safe procedure. However, problems may occur, including:  Infection.  Bleeding.  Allergic reaction to medicines or dyes.  Damage to your spinal cord or nerves.  Leaking of spinal fluid. This can cause a headache.  Damage to kidneys.  Seizures. This is rare. What happens before the procedure?  Follow instructions from your doctor about what you cannot eat or drink. You may be asked to drink more fluids.  Ask your doctor about changing or stopping your normal medicines. This is important if you take diabetes medicines or blood thinners.  Plan to have someone take you home from the hospital or clinic.  If you will be going home right after the procedure, plan to have someone with you for 24 hours. What happens during the procedure?  You will lie face down on a table.  Your doctor will find the best injection site on your spine. This is most often in the lower back.  This area will be washed with soap.  You will be given a medicine to numb the area (local  anesthetic).  Your doctor will place a long needle into the space around your spinal cord.  A sample of spinal fluid may be taken. This may be sent to the lab for testing.  The dye will be injected into the space around your spinal cord.  The exam table may be tilted. This helps the dye flow up or down your spine.  The X-ray will take images of your spinal cord.  A bandage (dressing) may be placed over the area where the dye was injected. The procedure may vary among doctors and hospitals. What can I expect after the procedure?  You may be monitored until you leave the hospital or clinic. This includes checking your blood pressure, heart rate, breathing rate, and blood oxygen level.  You may feel sore at the injection site. You may have a mild headache.  You will be told to lie flat with your head raised (elevated). This lowers the risk of a headache.  It is up to you to get the results of your procedure. Ask your doctor, or the department that is doing the procedure, when your results will be ready. Follow these instructions at home:  Rest as told by your doctor. Lie flat with your head slightly elevated.  Do not bend, lift, or do hard work for 24-48 hours, or as told by your doctor.  Take over-the-counter and prescription medicines only as told by your doctor.  Take care of your bandage as told by your doctor.    Drink enough fluid to keep your pee (urine) pale yellow.  Bathe or shower as told by your doctor.   Contact a doctor if:  You have a fever.  You have a headache that lasts longer than 24 hours.  You feel sick to your stomach (nauseous).  You vomit.  Your neck is stiff.  Your legs feel numb.  You cannot pee.  You cannot poop (no bowel movement).  You have a rash.  You are itchy or sneezing. Get help right away if:  You have new symptoms or your symptoms get worse.  You have a seizure.  You have trouble breathing. Summary  A myelogram is an  imaging test that checks for problems in the spinal cord and the places where nerves attach to the spinal cord (nerve roots).  Before the procedure, follow instructions from your doctor. You will be told what not to eat or drink, or what medicines to change or stop.  After the procedure, you will be told to lie flat with your head raised (elevated). This will lower your risk of a headache.  Do not bend, lift, or do any hard work for 24-48 hours, or as told by your doctor.  Contact a doctor if you have a stiff neck or numb legs. Get help right away if your symptoms get worse, or you have a seizure or trouble breathing. This information is not intended to replace advice given to you by your health care provider. Make sure you discuss any questions you have with your health care provider. Document Revised: 03/07/2018 Document Reviewed: 03/08/2018 Elsevier Patient Education  2021 [employer]vier Inc.  

## 2020-06-03 NOTE — Progress Notes (Signed)
Pt stable after lp.D/C instructions given.F/U with his MD.Thankyou.

## 2020-06-07 LAB — VITAMIN B1: Vitamin B1 (Thiamine): 147 nmol/L (ref 66.5–200.0)

## 2020-09-18 ENCOUNTER — Other Ambulatory Visit: Payer: Self-pay | Admitting: Neurology

## 2020-09-18 DIAGNOSIS — M4807 Spinal stenosis, lumbosacral region: Secondary | ICD-10-CM

## 2020-09-18 DIAGNOSIS — G959 Disease of spinal cord, unspecified: Secondary | ICD-10-CM

## 2020-09-18 DIAGNOSIS — M4804 Spinal stenosis, thoracic region: Secondary | ICD-10-CM

## 2020-09-18 DIAGNOSIS — M6281 Muscle weakness (generalized): Secondary | ICD-10-CM

## 2020-09-18 DIAGNOSIS — M4802 Spinal stenosis, cervical region: Secondary | ICD-10-CM

## 2020-09-22 ENCOUNTER — Ambulatory Visit (HOSPITAL_COMMUNITY)
Admission: RE | Admit: 2020-09-22 | Discharge: 2020-09-22 | Disposition: A | Discharge status: Home / Self Care | Payer: Medicare PPO | Source: Ambulatory Visit | Attending: Neurology | Admitting: Neurology

## 2020-09-22 ENCOUNTER — Other Ambulatory Visit: Payer: Self-pay

## 2020-09-22 DIAGNOSIS — M4802 Spinal stenosis, cervical region: Secondary | ICD-10-CM | POA: Diagnosis present

## 2020-09-22 DIAGNOSIS — M6281 Muscle weakness (generalized): Secondary | ICD-10-CM | POA: Diagnosis present

## 2020-09-22 DIAGNOSIS — G959 Disease of spinal cord, unspecified: Secondary | ICD-10-CM | POA: Diagnosis not present

## 2020-09-22 DIAGNOSIS — M4804 Spinal stenosis, thoracic region: Secondary | ICD-10-CM | POA: Insufficient documentation

## 2020-09-22 DIAGNOSIS — M4807 Spinal stenosis, lumbosacral region: Secondary | ICD-10-CM | POA: Insufficient documentation

## 2020-09-22 MED ORDER — GADOBUTROL 1 MMOL/ML IV SOLN
10.0000 mL | Freq: Once | INTRAVENOUS | Status: AC | PRN
  Administered 2020-09-22: 10 mL via INTRAVENOUS

## 2020-09-23 ENCOUNTER — Encounter: Payer: Self-pay | Admitting: Physical Therapy

## 2020-09-23 ENCOUNTER — Ambulatory Visit: Payer: Medicare PPO | Attending: Neurology | Admitting: Physical Therapy

## 2020-09-23 DIAGNOSIS — R2689 Other abnormalities of gait and mobility: Secondary | ICD-10-CM | POA: Insufficient documentation

## 2020-09-23 DIAGNOSIS — R2681 Unsteadiness on feet: Secondary | ICD-10-CM | POA: Diagnosis present

## 2020-09-23 DIAGNOSIS — R296 Repeated falls: Secondary | ICD-10-CM | POA: Insufficient documentation

## 2020-09-23 NOTE — Therapy (Signed)
[facility] MAIN [facility] SERVICES [address] [place], [place], [address] Phone: [phone]   Fax:  [phone]  Physical Therapy Evaluation  Patient Details  Name: Gabriel Simpson MRN: [id number] Date of Birth: 12-10-1945 Referring Provider (PT): Theora Master, MD   Encounter Date: 09/23/2020   PT End of Session - 09/23/20 1333     Visit Number 1    Number of Visits 24    Date for PT Re-Evaluation 12/16/20    Authorization Type [employer] (Auth req)    Authorization Time Period 09/23/20-12/16/20    Progress Note Due on Visit 10    PT Start Time 0905    PT Stop Time 1005    PT Time Calculation (min) 60 min    Equipment Utilized During Treatment Gait belt;Other (comment)   rollator   Activity Tolerance Patient tolerated treatment well;No increased pain;Patient limited by fatigue    Behavior During Therapy [facility] for tasks assessed/performed             Past Medical History:  Diagnosis Date   Allergy    Arthritis    Asthma    Hyperlipidemia    Hypertension     Past Surgical History:  Procedure Laterality Date   CERVICAL FUSION     LUMBAR FUSION     thumb surgery Left    TONSILLECTOMY  1955    There were no vitals filed for this visit.    Subjective Assessment - 09/23/20 0912     Subjective Pt reports he has had 2 falls in the last month. Pt reports he was sitting on rollator and it colapsed. he wound up falling backwards while sitton on the rolator. 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 Pt reports he has had cervical and lumbar surgeries. Pt also reports the nerves in his back are causing either or both of his knees to buckle. Pt also reports event may 23 where pt had amnesia event where he went to [place] where CAT scan and MRI did not show stroke. Pt caregiver and MD are concerned he had stroke in part of his brain that controls his LE. Pt wife reports on aug 3 and since pt  has been unable to control his balance on the right side. Pt fall hs caused laceration type injury to his arm when catching himself. For the past month the progression of his impairments have been very pronounced. Pt feels he can attribute his impairments to sitting on kees and painting and pulling weeds over the past several decades. Pt caregiver reports he started having drop foot in 2014, surgery was completed in his back and his pain levels improved. In 2019 pt developed new problems in foot. He had new surgery to T7-12 and surgeon punctured L1-5 and fluid was lost but it was sutured back and within a few days he was back to "his normal"    Patient is accompained by: Family member   Wife   Pertinent History Pt reports he has had 2 falls in the last month. Pt reports he was sitting on rollator and it colapsed. he wound up falling backwards while sitton on the rolator. 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 Pt reports he has had cervical and lumbar surgeries. Pt also reports the nerves in his back are causing either or both of his knees to buckle. Pt also reports event may  23 where pt had amnesia event where he went to [place] where CAT scan and MRI did not show stroke. Pt caregiver and MD are concerned he had stroke in part of his brain that controls his LE. Pt wife reports on aug 3 and since pt has been unable to control his balance on the right side. Pt fall hs caused laceration type injury to his arm when catching himself. For the past month the progression of his impairments have been very pronounced. Pt feels he can attribute his impairments to sitting on kees and painting and pulling weeds over the past several decades. Pt caregiver reports he started having drop foot in 2014, surgery was completed in his back and his pain levels improved. In 2019 pt developed new problems in foot. He had new surgery to T7-12 and surgeon punctured L1-5 and fluid was  lost but it was sutured back and within a few days he was back to "his normal" Pt has MRO of spine scheduled to screen for potential reasons for recent onset of foot drop.    Limitations Walking;Standing;House hold activities    How long can you walk comfortably? 100 yards currently, prior to recent exacerbation pt able to get around airport without significant difficulty but some fatigue    Diagnostic tests MRI on spine to be looked at    Patient Stated Goals Get AD in order to be safer.    Currently in Pain? No/denies                [facility] PT Assessment - 09/23/20 0001       Assessment   Medical Diagnosis Frequent Falls    Referring Provider (PT) Theora Master, MD    Onset Date/Surgical Date 06/01/20      Precautions   Precautions Fall      Balance Screen   Has the patient fallen in the past 6 months Yes    How many times? 2    Has the patient had a decrease in activity level because of a fear of falling?  Yes    Is the patient reluctant to leave their home because of a fear of falling?  No      Prior Function   Level of Independence Requires assistive device for independence      ROM / Strength   AROM / PROM / Strength Strength      Strength   Overall Strength Comments 5/5 strength grossly, 1/5 R ankle DF and PF             Trial with AD with and without AFO  With pt current Rolator (acquired for free)   Pt displayed good foot clearance but also had ataxia and hip internal rotation.  150 feet ambulation  -AD height was too low for pt although it was adjusted to maximal height  -slight trunk flexion due to decreased height of Rolator.  -height of seat was significantly too low for pt to perform STS transfer. -upon standing pt did not utilize brakes on Rolator.   With clinic Rolator adjusted to appropriate height for pt. And with AFO donned x 150 feet - Pt still displayed ataxic gait but pt demonstrated less trunk flexion -Improved steadiness with turns  while ambulating due to increased area for feet placement.  -With cues pt utilized brakes properly when transitioning form STS -With transfer trial and with brakes active pt was able to safely transition from standing to/from sitting   With Rolator without AFO  donned (per pt caregiver request)  -pt ambulated with steppage gait on the right side and no noticeable DF activation.   Due to pt fatigue with prolonged ambulation and his shoulder pain and pending shoulder surgery it is recommended pt utilize standard Rolator or Rolator with increased height of handholds. Rolator recommendation needs to have appropriate brakes, and should have a seat in order to allow for rest breaks as needed.               Objective measurements completed on examination: See above findings.                PT Education - 09/23/20 1329     Education Details Pt and caregiver Educated regarding rollator and walker height, gait belt use and safety    Person(s) Educated Patient;Spouse    Methods Explanation;Demonstration    Comprehension Verbalized understanding;Need further instruction              PT Short Term Goals - 09/23/20 1344       PT SHORT TERM GOAL #1   Title Patient will be independent in home exercise program to improve strength/mobility for better functional independence with ADLs.    Baseline Pt does not have HEP currently    Time 4    Period Weeks    Status New    Target Date 10/21/20      PT SHORT TERM GOAL #2   Title Patient will deny any falls over past 3 weeks to demonstrate improved safety awareness at home and work.    Baseline 2 falls within last month    Time 5    Period Weeks    Status New               PT Long Term Goals - 09/23/20 1346       PT LONG TERM GOAL #1   Title Patient will increase six minute walk test distance to >1000 with LRAD for progression to community ambulator and improve gait ability    Baseline Pt unable to ambulate longer  than 100-200 feet without fatigue    Time 10    Period Weeks    Status New    Target Date 12/02/20      PT LONG TERM GOAL #2   Title Pt will improve TUG with LRAD in less than 12 seconds in order to indicate reduced risk for falls.    Baseline not measured at eval    Time 10    Period Weeks    Status New    Target Date 12/02/20      PT LONG TERM GOAL #3   Title Pt will improve BERG balance score by 5 points or greater indicating clinically significant improvement in risk of falls.    Baseline Measured at visit 2, see flowsheet    Time 12    Period Weeks    Status New      PT LONG TERM GOAL #4   Title Patient (> 44 years old) will complete five times sit to stand test in < 15 seconds indicating an increased LE strength and improved balance.    Baseline compelted at second visit    Time 10    Period Weeks    Status New    Target Date 12/02/20      PT LONG TERM GOAL #5   Title Patient will increase FOTO score to equal or greater than  8 points   to demonstrate statistically significant improvement  in mobility and quality of life.    Baseline See flowsheet    Time 12    Period Weeks    Status New    Target Date 12/16/20                    Plan - 09/23/20 1335     Clinical Impression Statement Pt is a 75 y.o. male who presents with complaints of recent worsening of foot drop and ankle weakness on the right side which has also been associated with 2 recent falls. With recent progression pt began using a Rolator that was previously prescribed to his mother but he, his wife and MD agree it is not appropriate size for him. Upon trial of ambulating with this Rolator (hand me down rolator) it was evident it was too low of a height for pt, in addition pt modified Rolator to be more appropriate for his height which may have impaired the safety of the device. Pt was referred to PT for balance assessment as well as to assess him for new AD in preparation for his upcomming trip. It  is recommended the pt receives a new Rolator that is appropriate for his height and  impairments. Pt will continue to benefit from skilled physical therapy intervention to address his impairments, improve his QOL, and attain his therapy goals.     Personal Factors and Comorbidities Age;Comorbidity 1;Comorbidity 2;Comorbidity 3+    Comorbidities neuropathy, HTN, right foot drop    Examination-Activity Limitations Squat;Stairs;Stand;Transfers    Examination-Participation Restrictions Community Activity;Driving;Yard Work    Conservation officer, historic buildings Evolving/Moderate complexity    Clinical Decision Making Moderate    Rehab Potential Fair   pending medical diagnosis   PT Frequency 2x / week    PT Duration 12 weeks    PT Treatment/Interventions ADLs/Self Care Home Management;DME Instruction;Gait training;Stair training;Functional mobility training;Therapeutic activities;Therapeutic exercise;Balance training;Neuromuscular re-education;Passive range of motion;Dry needling;Energy conservation;Joint Manipulations    PT Next Visit Plan Tests and measures: 5X STS, BERG, with rolator, FOTO, other balance measures as indicated    PT Home Exercise Plan Establish next session    Consulted and Agree with Plan of Care Patient;Family member/caregiver    Family Member Consulted wife             Patient will benefit from skilled therapeutic intervention in order to improve the following deficits and impairments:  Abnormal gait, Decreased balance, Decreased endurance, Decreased mobility, Difficulty walking, Hypomobility, Impaired sensation, Decreased strength, Decreased activity tolerance  Visit Diagnosis: Frequent falls  Unsteadiness on feet  Abnormality of gait due to impairment of balance     Problem List Patient Active Problem List   Diagnosis Date Noted   AMS (altered mental status) 05/31/2020   Pre-diabetes 05/31/2020   HTN (hypertension) 05/31/2020   Right foot drop  05/31/2020   Peripheral neuropathy 05/31/2020   Anxiety 06/27/2019   Attention deficit hyperactivity disorder (ADHD), predominantly inattentive type 06/27/2019    Norman Herrlich, PT 09/23/2020, 2:00 PM  [facility] MAIN [facility] SERVICES [address] [place], [place], [address] Phone: [phone]   Fax:  [phone]  Name: Gabriel Simpson MRN: [id number] Date of Birth: 01-05-1946

## 2020-09-26 ENCOUNTER — Ambulatory Visit (HOSPITAL_COMMUNITY): Admission: RE | Admit: 2020-09-26 | Payer: Medicare PPO | Source: Ambulatory Visit

## 2020-09-26 ENCOUNTER — Ambulatory Visit (HOSPITAL_COMMUNITY): Payer: Medicare PPO

## 2020-10-15 ENCOUNTER — Ambulatory Visit: Payer: Medicare PPO

## 2020-10-20 ENCOUNTER — Ambulatory Visit: Payer: Medicare PPO

## 2020-10-22 ENCOUNTER — Ambulatory Visit: Payer: Medicare PPO | Admitting: Physical Therapy

## 2020-10-26 ENCOUNTER — Ambulatory Visit: Payer: Medicare PPO

## 2020-10-29 ENCOUNTER — Ambulatory Visit: Payer: Medicare PPO

## 2020-11-02 ENCOUNTER — Ambulatory Visit: Payer: Medicare PPO | Admitting: Physical Therapy

## 2020-11-05 ENCOUNTER — Ambulatory Visit: Payer: Medicare PPO | Admitting: Physical Therapy

## 2020-11-09 ENCOUNTER — Ambulatory Visit: Payer: Medicare PPO | Admitting: Physical Therapy

## 2020-11-12 ENCOUNTER — Ambulatory Visit: Payer: Medicare PPO | Admitting: Physical Therapy

## 2020-11-16 ENCOUNTER — Ambulatory Visit: Payer: Medicare PPO

## 2020-11-19 ENCOUNTER — Ambulatory Visit: Payer: Medicare PPO | Admitting: Physical Therapy

## 2020-11-23 ENCOUNTER — Ambulatory Visit: Payer: Medicare PPO | Admitting: Physical Therapy

## 2020-11-26 ENCOUNTER — Ambulatory Visit: Payer: Medicare PPO | Admitting: Physical Therapy

## 2020-11-30 ENCOUNTER — Ambulatory Visit: Payer: Medicare PPO | Admitting: Physical Therapy

## 2020-12-02 ENCOUNTER — Ambulatory Visit: Payer: Medicare PPO | Admitting: Physical Therapy

## 2020-12-07 ENCOUNTER — Ambulatory Visit: Payer: Medicare PPO | Admitting: Physical Therapy

## 2020-12-10 ENCOUNTER — Ambulatory Visit: Payer: Medicare PPO | Admitting: Physical Therapy

## 2020-12-14 ENCOUNTER — Ambulatory Visit: Payer: Medicare PPO | Admitting: Physical Therapy

## 2020-12-16 ENCOUNTER — Ambulatory Visit: Payer: Medicare PPO | Admitting: Physical Therapy

## 2020-12-21 ENCOUNTER — Ambulatory Visit: Payer: Medicare PPO | Admitting: Physical Therapy

## 2020-12-23 ENCOUNTER — Ambulatory Visit: Payer: Medicare PPO | Admitting: Physical Therapy

## 2020-12-28 ENCOUNTER — Ambulatory Visit: Payer: Medicare PPO | Admitting: Physical Therapy

## 2020-12-30 ENCOUNTER — Ambulatory Visit: Payer: Medicare PPO | Admitting: Physical Therapy

## 2021-01-06 ENCOUNTER — Ambulatory Visit: Payer: Medicare PPO | Admitting: Physical Therapy

## 2021-08-11 ENCOUNTER — Other Ambulatory Visit: Payer: Self-pay | Admitting: Infectious Diseases

## 2021-08-11 DIAGNOSIS — R270 Ataxia, unspecified: Secondary | ICD-10-CM

## 2021-08-11 DIAGNOSIS — M542 Cervicalgia: Secondary | ICD-10-CM

## 2021-08-27 ENCOUNTER — Ambulatory Visit (HOSPITAL_COMMUNITY)
Admission: RE | Admit: 2021-08-27 | Discharge: 2021-08-27 | Disposition: A | Discharge status: Home / Self Care | Payer: Medicare Other | Source: Ambulatory Visit | Attending: Infectious Diseases | Admitting: Infectious Diseases

## 2021-08-27 DIAGNOSIS — R270 Ataxia, unspecified: Secondary | ICD-10-CM | POA: Diagnosis present

## 2021-08-27 DIAGNOSIS — M542 Cervicalgia: Secondary | ICD-10-CM | POA: Diagnosis present

## 2021-08-27 MED ORDER — GADOBUTROL 1 MMOL/ML IV SOLN
8.0000 mL | Freq: Once | INTRAVENOUS | Status: AC | PRN
  Administered 2021-08-27: 8 mL via INTRAVENOUS

## 2021-10-26 NOTE — Progress Notes (Signed)
Sent message, via epic in basket, requesting orders in epic from surgeon.  

## 2021-10-27 ENCOUNTER — Inpatient Hospital Stay (HOSPITAL_COMMUNITY): Admission: RE | Admit: 2021-10-27 | Payer: Medicare Other | Source: Ambulatory Visit

## 2021-10-29 NOTE — Progress Notes (Signed)
Sent message second request, via epic in basket, requesting orders in epic from Psychologist, sport and exercise.

## 2021-10-31 NOTE — Progress Notes (Signed)
COVID Vaccine received:  []  No [x]  Yes Date of any COVID positive Test in last 90 days:  PCP - Adrian Prows, MD [place], [place] [address]   Phone: [phone]   Fax: [phone]     Medical Clearance-  10-15-21  note in [place]  Cardiologist - none  Chest x-ray - 05-31-2020  Epic EKG - 05-31-2020 Epic  Stress Test - n/a ECHO - n/a Cardiac Cath - n/a  Pacemaker/ICD device     [x]  N/A Spinal Cord Stimulator:[]  No []  Yes      (Remind patient to bring remote DOS) Other Implants:   History of Sleep Apnea? []  No []  Yes   Sleep Study Date:   CPAP used?- []  No []  Yes  (Instruct to bring their mask & Tubing)  Does the patient monitor blood sugar? []  No []  Yes  []  N/A Does patient have a [employer] or Dexacom? []  No []  Yes   Fasting Blood Sugar Ranges-  Checks Blood Sugar _____ times a day  Blood Thinner Instructions: Aspirin Instructions: ASA 81 mg Last Dose:  ERAS Protocol Ordered: []  No  []  Yes PRE-SURGERY []  ENSURE  []  G2   Comments: NO MD ORDERS as of 10-31-21. Tamika sent IB messages x 2  Activity level: Patient can / can not climb a flight of stairs without difficulty;  []  No CP  []  No SOB,  but would have ______   Patient can / can not perform ADLs without assistance.   Anesthesia review: HTN Pre/DM  (Hgb A1C -  6.0  on 10-15-2021)  Patient denies shortness of breath, fever, cough and chest pain at PAT appointment.  Patient verbalized understanding and agreement to the Pre-Surgical Instructions that were given to them at this PAT appointment. Patient was also educated of the need to review these PAT instructions again prior to his/her surgery.I reviewed the appropriate phone numbers to call if they have any and questions or concerns.

## 2021-10-31 NOTE — Patient Instructions (Signed)
SURGICAL WAITING ROOM VISITATION Patients having surgery or a procedure may have no more than 2 support people in the waiting area - these visitors may rotate in the visitor waiting room.   Children under the age of 78 must have an adult with them who is not the patient. If the patient needs to stay at the hospital during part of their recovery, the visitor guidelines for inpatient rooms apply.  PRE-OP VISITATION  Pre-op nurse will coordinate an appropriate time for 1 support person to accompany the patient in pre-op.  This support person may not rotate.  This visitor will be contacted when the time is appropriate for the visitor to come back in the pre-op area.  Please refer to the [facility] website for the visitor guidelines for Inpatients (after your surgery is over and you are in a regular room).  You are not required to quarantine at this time prior to your surgery. However, you must do this: Hand Hygiene often Do NOT share personal items Notify your provider if you are in close contact with someone who has COVID or you develop fever 100.4 or greater, new onset of sneezing, cough, sore throat, shortness of breath or body aches.  If you test positive for Covid or have been in contact with anyone that has tested positive in the last 10 days please notify you surgeon.    Your procedure is scheduled on  Thursday  November 18, 2021  Report to [facility] Main Entrance: [place] entrance where the [employer] is available.   Report to admitting at:  12:30 PM  +++++Call this number if you have any questions or problems the morning of surgery [phone]  Do not eat food after Midnight the night prior to your surgery/procedure.  After Midnight you may have the following liquids until   12:00  Noon the DAY OF SURGERY  Clear Liquid Diet Water Black Coffee (sugar ok, NO MILK/CREAM OR CREAMERS)  Tea (sugar ok, NO MILK/CREAM OR CREAMERS) regular and decaf                              Plain Jell-O  with no fruit (NO RED)                                           Fruit ices (not with fruit pulp, NO RED)                                     Popsicles (NO RED)                                                                  Juice: apple, WHITE grape, WHITE cranberry Sports drinks like Gatorade or Powerade (NO RED)                   The day of surgery:  Drink ONE (1) Pre-Surgery Clear Ensure or G2 at        AM the morning of surgery. Drink in one sitting. Do  not sip.  This drink was given to you during your hospital pre-op appointment visit. Nothing else to drink after completing the Pre-Surgery Clear Ensure or G2 : No candy, chewing gum or throat lozenges.    FOLLOW ANY ADDITIONAL PRE OP INSTRUCTIONS YOU RECEIVED FROM YOUR SURGEON'S OFFICE!!!   Oral Hygiene is also important to reduce your risk of infection.        Remember - BRUSH YOUR TEETH THE MORNING OF SURGERY WITH YOUR REGULAR TOOTHPASTE  Do NOT smoke after Midnight the night before surgery.  Take ONLY these medicines the morning of surgery with A SIP OF WATER: sertraline (Zoloft), Finasteride (Proscar), Tamsulosin (Flomax)                   You may not have any metal on your body including  jewelry, and body piercing  Do not wear lotions, powders,  cologne, or deodorant  Men may shave face and neck.  Contacts, Hearing Aids, dentures or bridgework may not be worn into surgery.   Patients discharged on the day of surgery will not be allowed to drive home.  Someone NEEDS to stay with you for the first 24 hours after anesthesia.  Do not bring your home medications to the hospital. The Pharmacy will dispense medications listed on your medication list to you during your admission in the Hospital.  Special Instructions: Bring a copy of your healthcare power of attorney and living will documents the day of surgery, if you wish to have them scanned into your [facility] Records- EPIC  Please read over  the following fact sheets you were given: IF YOU HAVE QUESTIONS ABOUT YOUR PRE-OP INSTRUCTIONS, PLEASE CALL [phone]  ([place])   [facility] - Preparing for Surgery Before surgery, you can play an important role.  Because skin is not sterile, your skin needs to be as free of germs as possible.  You can reduce the number of germs on your skin by washing with CHG (chlorahexidine gluconate) soap before surgery.  CHG is an antiseptic cleaner which kills germs and bonds with the skin to continue killing germs even after washing. Please DO NOT use if you have an allergy to CHG or antibacterial soaps.  If your skin becomes reddened/irritated stop using the CHG and inform your nurse when you arrive at Short Stay. Do not shave (including legs and underarms) for at least 48 hours prior to the first CHG shower.  You may shave your face/neck.  Please follow these instructions carefully:  1.  Shower with CHG Soap the night before surgery and the  morning of surgery.  2.  If you choose to wash your hair, wash your hair first as usual with your normal  shampoo.  3.  After you shampoo, rinse your hair and body thoroughly to remove the shampoo.                             4.  Use CHG as you would any other liquid soap.  You can apply chg directly to the skin and wash.  Gently with a scrungie or clean washcloth.  5.  Apply the CHG Soap to your body ONLY FROM THE NECK DOWN.   Do not use on face/ open                           Wound or open sores. Avoid contact with eyes, ears  mouth and genitals (private parts).                       Wash face,  Genitals (private parts) with your normal soap.             6.  Wash thoroughly, paying special attention to the area where your  surgery  will be performed.  7.  Thoroughly rinse your body with warm water from the neck down.  8.  DO NOT shower/wash with your normal soap after using and rinsing off the CHG Soap.            9.  Pat yourself dry with a clean towel.             10.  Wear clean pajamas.            11.  Place clean sheets on your bed the night of your first shower and do not  sleep with pets.  ON THE DAY OF SURGERY : Do not apply any lotions/deodorants the morning of surgery.  Please wear clean clothes to the hospital/surgery center.    FAILURE TO FOLLOW THESE INSTRUCTIONS MAY RESULT IN THE CANCELLATION OF YOUR SURGERY  PATIENT SIGNATURE_________________________________  NURSE SIGNATURE__________________________________  ________________________________________________________________________

## 2021-11-02 ENCOUNTER — Encounter (HOSPITAL_COMMUNITY)
Admission: RE | Admit: 2021-11-02 | Discharge: 2021-11-02 | Disposition: A | Discharge status: Home / Self Care | Payer: Medicare Other | Source: Ambulatory Visit | Attending: Orthopedic Surgery | Admitting: Orthopedic Surgery

## 2021-11-02 ENCOUNTER — Other Ambulatory Visit: Payer: Self-pay

## 2021-11-02 ENCOUNTER — Encounter (HOSPITAL_COMMUNITY): Payer: Self-pay

## 2021-11-02 VITALS — BP 130/84 | HR 68 | Temp 98.5°F | Resp 16 | Ht 72.0 in | Wt 184.0 lb

## 2021-11-02 DIAGNOSIS — Z01818 Encounter for other preprocedural examination: Secondary | ICD-10-CM | POA: Diagnosis present

## 2021-11-02 DIAGNOSIS — I10 Essential (primary) hypertension: Secondary | ICD-10-CM | POA: Diagnosis not present

## 2021-11-02 DIAGNOSIS — R7303 Prediabetes: Secondary | ICD-10-CM | POA: Insufficient documentation

## 2021-11-02 HISTORY — DX: Prediabetes: R73.03

## 2021-11-02 HISTORY — DX: Other complications of anesthesia, initial encounter: T88.59XA

## 2021-11-02 LAB — CBC
HCT: 45.7 % (ref 39.0–52.0)
Hemoglobin: 14.8 g/dL (ref 13.0–17.0)
MCH: 31.1 pg (ref 26.0–34.0)
MCHC: 32.4 g/dL (ref 30.0–36.0)
MCV: 96 fL (ref 80.0–100.0)
Platelets: 263 10*3/uL (ref 150–400)
RBC: 4.76 MIL/uL (ref 4.22–5.81)
RDW: 13.4 % (ref 11.5–15.5)
WBC: 4.8 10*3/uL (ref 4.0–10.5)
nRBC: 0 % (ref 0.0–0.2)

## 2021-11-02 LAB — BASIC METABOLIC PANEL
Anion gap: 9 (ref 5–15)
BUN: 19 mg/dL (ref 8–23)
CO2: 27 mmol/L (ref 22–32)
Calcium: 9.2 mg/dL (ref 8.9–10.3)
Chloride: 106 mmol/L (ref 98–111)
Creatinine, Ser: 0.95 mg/dL (ref 0.61–1.24)
GFR, Estimated: >60 mL/min (ref >60)
Glucose, Bld: 93 mg/dL (ref 70–99)
Potassium: 4.4 mmol/L (ref 3.5–5.1)
Sodium: 142 mmol/L (ref 135–145)

## 2021-11-02 LAB — SURGICAL PCR SCREEN
MRSA, PCR: NEGATIVE
Staphylococcus aureus: NEGATIVE

## 2021-11-02 LAB — GLUCOSE, CAPILLARY: Glucose-Capillary: 93 mg/dL (ref 70–99)

## 2021-11-04 ENCOUNTER — Encounter: Payer: Self-pay | Admitting: Orthopedic Surgery

## 2021-11-18 DIAGNOSIS — R7303 Prediabetes: Secondary | ICD-10-CM

## 2021-11-18 DIAGNOSIS — I10 Essential (primary) hypertension: Secondary | ICD-10-CM

## 2021-11-18 DIAGNOSIS — Z01818 Encounter for other preprocedural examination: Secondary | ICD-10-CM

## 2021-12-08 NOTE — Progress Notes (Addendum)
COVID Vaccine Completed: yes  Date of COVID positive in last 90 days:  PCP - Clydie Braun, MD Cardiologist -   Medical/cardiac clearance by Clydie Braun 10/18/21 on chart  Chest x-ray -  EKG - 11/02/21 Epic Stress Test -  ECHO -  Cardiac Cath -  Pacemaker/ICD device last checked: Spinal Cord Stimulator:  Bowel Prep -   Sleep Study -  CPAP -   Fasting Blood Sugar - pre DM Checks Blood Sugar _____ times a day  Last dose of GLP1 agonist-  N/A GLP1 instructions:  N/A   Last dose of SGLT-2 inhibitors-  N/A SGLT-2 instructions: N/A   Blood Thinner Instructions: Aspirin Instructions: Last Dose:  Activity level:  Can go up a flight of stairs and perform activities of daily living without stopping and without symptoms of chest pain or shortness of breath.  Able to exercise without symptoms  Unable to go up a flight of stairs without symptoms of     Anesthesia review:   Patient denies shortness of breath, fever, cough and chest pain at PAT appointment  Patient verbalized understanding of instructions that were given to them at the PAT appointment. Patient was also instructed that they will need to review over the PAT instructions again at home before surgery.

## 2021-12-08 NOTE — Patient Instructions (Signed)
SURGICAL WAITING ROOM VISITATION Patients having surgery or a procedure may have no more than 2 support people in the waiting area - these visitors may rotate.   Children under the age of 69 must have an adult with them who is not the patient. If the patient needs to stay at the hospital during part of their recovery, the visitor guidelines for inpatient rooms apply. Pre-op nurse will coordinate an appropriate time for 1 support person to accompany patient in pre-op.  This support person may not rotate.    Please refer to the [facility] website for the visitor guidelines for Inpatients (after your surgery is over and you are in a regular room).    Your procedure is scheduled on: 12/16/21   Report to [facility] Main Entrance    Report to admitting at 10:00 AM   Call this number if you have problems the morning of surgery [phone]   Do not eat food :After Midnight.   After Midnight you may have the following liquids until 9:30 AM DAY OF SURGERY  Water Non-Citrus Juices (without pulp, NO RED) Carbonated Beverages Black Coffee (NO MILK/CREAM OR CREAMERS, sugar ok)  Clear Tea (NO MILK/CREAM OR CREAMERS, sugar ok) regular and decaf                             Plain Jell-O (NO RED)                                           Fruit ices (not with fruit pulp, NO RED)                                     Popsicles (NO RED)                                                               Sports drinks like Gatorade (NO RED)                 The day of surgery:  Drink ONE (1) Pre-Surgery G2 at 9:30 AM the morning of surgery. Drink in one sitting. Do not sip.  This drink was given to you during your hospital  pre-op appointment visit. Nothing else to drink after completing the  Pre-Surgery G2.          If you have questions, please contact your surgeon's office.   FOLLOW BOWEL PREP AND ANY ADDITIONAL PRE OP INSTRUCTIONS YOU RECEIVED FROM YOUR SURGEON'S OFFICE!!!     Oral Hygiene  is also important to reduce your risk of infection.                                    Remember - BRUSH YOUR TEETH THE MORNING OF SURGERY WITH YOUR REGULAR TOOTHPASTE   Take these medicines the morning of surgery with A SIP OF WATER: Atorvastatin, Zyrtec, Finasteride, Sertraline, Flomax  DO NOT TAKE ANY ORAL DIABETIC MEDICATIONS DAY OF YOUR SURGERY  How to Manage Your  Diabetes Before and After Surgery  Why is it important to control my blood sugar before and after surgery? Improving blood sugar levels before and after surgery helps healing and can limit problems. A way of improving blood sugar control is eating a healthy diet by:  Eating less sugar and carbohydrates  Increasing activity/exercise  Talking with your doctor about reaching your blood sugar goals High blood sugars (greater than 180 mg/dL) can raise your risk of infections and slow your recovery, so you will need to focus on controlling your diabetes during the weeks before surgery. Make sure that the doctor who takes care of your diabetes knows about your planned surgery including the date and location.  How do I manage my blood sugar before surgery? Check your blood sugar at least 4 times a day, starting 2 days before surgery, to make sure that the level is not too high or low. Check your blood sugar the morning of your surgery when you wake up and every 2 hours until you get to the Short Stay unit. If your blood sugar is less than 70 mg/dL, you will need to treat for low blood sugar: Do not take insulin. Treat a low blood sugar (less than 70 mg/dL) with  cup of clear juice (cranberry or apple), 4 glucose tablets, OR glucose gel. Recheck blood sugar in 15 minutes after treatment (to make sure it is greater than 70 mg/dL). If your blood sugar is not greater than 70 mg/dL on recheck, call [phone] for further instructions. Report your blood sugar to the short stay nurse when you get to Short Stay.  If you are admitted to  the hospital after surgery: Your blood sugar will be checked by the staff and you will probably be given insulin after surgery (instead of oral diabetes medicines) to make sure you have good blood sugar levels. The goal for blood sugar control after surgery is 80-180 mg/dL.  Reviewed and Endorsed by [facility] Patient Education Committee, August 2015             You may not have any metal on your body including  jewelry, and body piercing             Do not wear lotions, powders, cologne, or deodorant              Men may shave face and neck.   Do not bring valuables to the hospital. [facility] IS NOT             RESPONSIBLE   FOR VALUABLES.  DO NOT BRING YOUR HOME MEDICATIONS TO THE HOSPITAL. PHARMACY WILL DISPENSE MEDICATIONS LISTED ON YOUR MEDICATION LIST TO YOU DURING YOUR ADMISSION IN THE HOSPITAL!    Patients discharged on the day of surgery will not be allowed to drive home.  Someone NEEDS to stay with you for the first 24 hours after anesthesia.              Please read over the following fact sheets you were given: IF YOU HAVE QUESTIONS ABOUT YOUR PRE-OP INSTRUCTIONS PLEASE CALL [phone] Contras    If you received a COVID test during your pre-op visit  it is requested that you wear a mask when out in public, stay away from anyone that may not be feeling well and notify your surgeon if you develop symptoms. If you test positive for Covid or have been in contact with anyone that has tested positive in the last 10 days please notify you  Careers adviser.  [place] - Preparing for Surgery Before surgery, you can play an important role.  Because skin is not sterile, your skin needs to be as free of germs as possible.  You can reduce the number of germs on your skin by washing with CHG (chlorahexidine gluconate) soap before surgery.  CHG is an antiseptic cleaner which kills germs and bonds with the skin to continue killing germs even after washing. Please DO NOT use if you have an  allergy to CHG or antibacterial soaps.  If your skin becomes reddened/irritated stop using the CHG and inform your nurse when you arrive at Short Stay. Do not shave (including legs and underarms) for at least 48 hours prior to the first CHG shower.  You may shave your face/neck.  Please follow these instructions carefully:  1.  Shower with CHG Soap the night before surgery and the  morning of surgery.  2.  If you choose to wash your hair, wash your hair first as usual with your normal  shampoo.  3.  After you shampoo, rinse your hair and body thoroughly to remove the shampoo.                             4.  Use CHG as you would any other liquid soap.  You can apply chg directly to the skin and wash.  Gently with a scrungie or clean washcloth.  5.  Apply the CHG Soap to your body ONLY FROM THE NECK DOWN.   Do   not use on face/ open                           Wound or open sores. Avoid contact with eyes, ears mouth and   genitals (private parts).                       Wash face,  Genitals (private parts) with your normal soap.             6.  Wash thoroughly, paying special attention to the area where your    surgery  will be performed.  7.  Thoroughly rinse your body with warm water from the neck down.  8.  DO NOT shower/wash with your normal soap after using and rinsing off the CHG Soap.                9.  Pat yourself dry with a clean towel.            10.  Wear clean pajamas.            11.  Place clean sheets on your bed the night of your first shower and do not  sleep with pets. Day of Surgery : Do not apply any lotions/deodorants the morning of surgery.  Please wear clean clothes to the hospital/surgery center.  FAILURE TO FOLLOW THESE INSTRUCTIONS MAY RESULT IN THE CANCELLATION OF YOUR SURGERY  PATIENT SIGNATURE_________________________________  NURSE SIGNATURE__________________________________  ________________________________________________________________________       Rogelia Mire  An incentive spirometer is a tool that can help keep your lungs clear and active. This tool measures how well you are filling your lungs with each breath. Taking long deep breaths may help reverse or decrease the chance of developing breathing (pulmonary) problems (especially infection) following: A long period of time when you are unable to move or be active.  BEFORE THE PROCEDURE  If the spirometer includes an indicator to show your best effort, your nurse or respiratory therapist will set it to a desired goal. If possible, sit up straight or lean slightly forward. Try not to slouch. Hold the incentive spirometer in an upright position. INSTRUCTIONS FOR USE  Sit on the edge of your bed if possible, or sit up as far as you can in bed or on a chair. Hold the incentive spirometer in an upright position. Breathe out normally. Place the mouthpiece in your mouth and seal your lips tightly around it. Breathe in slowly and as deeply as possible, raising the piston or the ball toward the top of the column. Hold your breath for 3-5 seconds or for as long as possible. Allow the piston or ball to fall to the bottom of the column. Remove the mouthpiece from your mouth and breathe out normally. Rest for a few seconds and repeat Steps 1 through 7 at least 10 times every 1-2 hours when you are awake. Take your time and take a few normal breaths between deep breaths. The spirometer may include an indicator to show your best effort. Use the indicator as a goal to work toward during each repetition. After each set of 10 deep breaths, practice coughing to be sure your lungs are clear. If you have an incision (the cut made at the time of surgery), support your incision when coughing by placing a pillow or rolled up towels firmly against it. Once you are able to get out of bed, walk around indoors and cough well. You may stop using the incentive spirometer when instructed by your caregiver.   RISKS AND COMPLICATIONS Take your time so you do not get dizzy or light-headed. If you are in pain, you may need to take or ask for pain medication before doing incentive spirometry. It is harder to take a deep breath if you are having pain. AFTER USE Rest and breathe slowly and easily. It can be helpful to keep track of a log of your progress. Your caregiver can provide you with a simple table to help with this. If you are using the spirometer at home, follow these instructions: SEEK MEDICAL CARE IF:  You are having difficultly using the spirometer. You have trouble using the spirometer as often as instructed. Your pain medication is not giving enough relief while using the spirometer. You develop fever of 100.5 F (38.1 C) or higher. SEEK IMMEDIATE MEDICAL CARE IF:  You cough up bloody sputum that had not been present before. You develop fever of 102 F (38.9 C) or greater. You develop worsening pain at or near the incision site. MAKE SURE YOU:  Understand these instructions. Will watch your condition. Will get help right away if you are not doing well or get worse. Document Released: 05/09/2006 Document Revised: 03/21/2011 Document Reviewed: 07/10/2006 [employer] Patient Information 2014 [employer].   ________________________________________________________________________  [facility]    Before surgery, you can play an important role. Because skin is not sterile, your skin needs to be as free of germs as possible. You can reduce the number of germs on your skin by using the following products. Benzoyl Peroxide Gel Reduces the number of germs present on the skin Applied twice a day to shoulder area starting two days before surgery    ==================================================================  Please follow these instructions carefully:  BENZOYL PEROXIDE 5% GEL  Please do not use if you have an allergy to  benzoyl  peroxide.   If your skin becomes reddened/irritated stop using the benzoyl peroxide.  Starting two days before surgery, apply as follows: Apply benzoyl peroxide in the morning and at night. Apply after taking a shower. If you are not taking a shower clean entire shoulder front, back, and side along with the armpit with a clean wet washcloth.  Place a quarter-sized dollop on your shoulder and rub in thoroughly, making sure to cover the front, back, and side of your shoulder, along with the armpit.   2 days before ____ AM   ____ PM              1 day before ____ AM   ____ PM                         Do this twice a day for two days.  (Last application is the night before surgery, AFTER using the CHG soap as described below).  Do NOT apply benzoyl peroxide gel on the day of surgery.

## 2021-12-09 ENCOUNTER — Encounter (HOSPITAL_COMMUNITY): Payer: Self-pay

## 2021-12-09 ENCOUNTER — Encounter (HOSPITAL_COMMUNITY)
Admission: RE | Admit: 2021-12-09 | Discharge: 2021-12-09 | Disposition: A | Discharge status: Home / Self Care | Payer: Medicare Other | Source: Ambulatory Visit | Attending: Orthopedic Surgery | Admitting: Orthopedic Surgery

## 2021-12-09 VITALS — BP 149/87 | HR 92 | Temp 97.9°F | Resp 14 | Ht 72.0 in | Wt 183.0 lb

## 2021-12-09 DIAGNOSIS — Z01818 Encounter for other preprocedural examination: Secondary | ICD-10-CM | POA: Insufficient documentation

## 2021-12-09 DIAGNOSIS — I2510 Atherosclerotic heart disease of native coronary artery without angina pectoris: Secondary | ICD-10-CM | POA: Insufficient documentation

## 2021-12-09 DIAGNOSIS — R7303 Prediabetes: Secondary | ICD-10-CM | POA: Diagnosis not present

## 2021-12-09 HISTORY — DX: Attention-deficit hyperactivity disorder, unspecified type: F90.9

## 2021-12-09 HISTORY — DX: Anxiety disorder, unspecified: F41.9

## 2021-12-09 LAB — CBC
HCT: 46.1 % (ref 39.0–52.0)
Hemoglobin: 15.3 g/dL (ref 13.0–17.0)
MCH: 31.2 pg (ref 26.0–34.0)
MCHC: 33.2 g/dL (ref 30.0–36.0)
MCV: 94.1 fL (ref 80.0–100.0)
Platelets: 253 10*3/uL (ref 150–400)
RBC: 4.9 MIL/uL (ref 4.22–5.81)
RDW: 12.9 % (ref 11.5–15.5)
WBC: 5.4 10*3/uL (ref 4.0–10.5)
nRBC: 0 % (ref 0.0–0.2)

## 2021-12-09 LAB — BASIC METABOLIC PANEL
Anion gap: 9 (ref 5–15)
BUN: 21 mg/dL (ref 8–23)
CO2: 25 mmol/L (ref 22–32)
Calcium: 9.3 mg/dL (ref 8.9–10.3)
Chloride: 104 mmol/L (ref 98–111)
Creatinine, Ser: 0.93 mg/dL (ref 0.61–1.24)
GFR, Estimated: >60 mL/min (ref >60)
Glucose, Bld: 107 mg/dL — ABNORMAL HIGH (ref 70–99)
Potassium: 3.9 mmol/L (ref 3.5–5.1)
Sodium: 138 mmol/L (ref 135–145)

## 2021-12-09 LAB — SURGICAL PCR SCREEN
MRSA, PCR: NEGATIVE
Staphylococcus aureus: NEGATIVE

## 2021-12-09 LAB — GLUCOSE, CAPILLARY: Glucose-Capillary: 113 mg/dL — ABNORMAL HIGH (ref 70–99)

## 2021-12-10 LAB — HEMOGLOBIN A1C
Hgb A1c MFr Bld: 6.1 % — ABNORMAL HIGH (ref 4.8–5.6)
Mean Plasma Glucose: 128 mg/dL

## 2021-12-15 NOTE — Anesthesia Preprocedure Evaluation (Signed)
Anesthesia Evaluation  Patient identified by MRN, date of birth, ID band Patient awake    Reviewed: Allergy & Precautions, NPO status , Patient's Chart, lab work & pertinent test results  History of Anesthesia Complications (+) history of anesthetic complications (decreased HR and respirations)  Airway        Dental   Pulmonary asthma           Cardiovascular hypertension (losartan-HCTZ), Pt. on medications   HLD   Neuro/Psych  PSYCHIATRIC DISORDERS (ADHD) Anxiety     S/p c-spine surgery  Neuromuscular disease (peripheral neuropathy, right foot drop)    GI/Hepatic   Endo/Other  Pre-diabetes  Renal/GU      Musculoskeletal  (+) Arthritis ,    Abdominal   Peds  Hematology   Anesthesia Other Findings   Reproductive/Obstetrics                              Anesthesia Physical Anesthesia Plan  ASA: 3  Anesthesia Plan: General   Post-op Pain Management: Regional block*   Induction: Intravenous  PONV Risk Score and Plan: 2 and Ondansetron and Dexamethasone  Airway Management Planned: Oral ETT  Additional Equipment:   Intra-op Plan:   Post-operative Plan:   Informed Consent:      Dental advisory given  Plan Discussed with: CRNA and Anesthesiologist  Anesthesia Plan Comments: (Discussed potential risks of nerve blocks including, but not limited to, infection, bleeding, nerve damage, seizures, pneumothorax, respiratory depression, and potential failure of the block. Alternatives to nerve blocks discussed. All questions answered.  Risks of general anesthesia discussed including, but not limited to, sore throat, hoarse voice, chipped/damaged teeth, injury to vocal cords, nausea and vomiting, allergic reactions, lung infection, heart attack, stroke, and death. All questions answered. )         Anesthesia Quick Evaluation

## 2021-12-16 ENCOUNTER — Other Ambulatory Visit: Payer: Self-pay

## 2021-12-16 ENCOUNTER — Ambulatory Visit (HOSPITAL_BASED_OUTPATIENT_CLINIC_OR_DEPARTMENT_OTHER): Payer: Medicare Other | Admitting: Certified Registered"

## 2021-12-16 ENCOUNTER — Encounter (HOSPITAL_COMMUNITY): Payer: Self-pay | Admitting: Orthopedic Surgery

## 2021-12-16 ENCOUNTER — Ambulatory Visit (HOSPITAL_COMMUNITY)
Admission: RE | Admit: 2021-12-16 | Discharge: 2021-12-16 | Disposition: A | Discharge status: Home / Self Care | Payer: Medicare Other | Source: Ambulatory Visit | Attending: Orthopedic Surgery | Admitting: Orthopedic Surgery

## 2021-12-16 ENCOUNTER — Ambulatory Visit (HOSPITAL_COMMUNITY): Payer: Medicare Other | Admitting: Physician Assistant

## 2021-12-16 ENCOUNTER — Encounter (HOSPITAL_COMMUNITY)
Admission: RE | Disposition: A | Discharge status: Home / Self Care | Payer: Self-pay | Source: Ambulatory Visit | Attending: Orthopedic Surgery

## 2021-12-16 DIAGNOSIS — F909 Attention-deficit hyperactivity disorder, unspecified type: Secondary | ICD-10-CM | POA: Insufficient documentation

## 2021-12-16 DIAGNOSIS — M75101 Unspecified rotator cuff tear or rupture of right shoulder, not specified as traumatic: Secondary | ICD-10-CM

## 2021-12-16 DIAGNOSIS — R7303 Prediabetes: Secondary | ICD-10-CM

## 2021-12-16 DIAGNOSIS — G629 Polyneuropathy, unspecified: Secondary | ICD-10-CM | POA: Insufficient documentation

## 2021-12-16 DIAGNOSIS — J45909 Unspecified asthma, uncomplicated: Secondary | ICD-10-CM | POA: Diagnosis not present

## 2021-12-16 DIAGNOSIS — I10 Essential (primary) hypertension: Secondary | ICD-10-CM

## 2021-12-16 DIAGNOSIS — Z8261 Family history of arthritis: Secondary | ICD-10-CM | POA: Diagnosis not present

## 2021-12-16 DIAGNOSIS — Z01818 Encounter for other preprocedural examination: Secondary | ICD-10-CM

## 2021-12-16 DIAGNOSIS — Z79899 Other long term (current) drug therapy: Secondary | ICD-10-CM | POA: Diagnosis not present

## 2021-12-16 DIAGNOSIS — M19011 Primary osteoarthritis, right shoulder: Secondary | ICD-10-CM | POA: Insufficient documentation

## 2021-12-16 DIAGNOSIS — E785 Hyperlipidemia, unspecified: Secondary | ICD-10-CM | POA: Diagnosis not present

## 2021-12-16 DIAGNOSIS — F419 Anxiety disorder, unspecified: Secondary | ICD-10-CM | POA: Diagnosis not present

## 2021-12-16 DIAGNOSIS — M12811 Other specific arthropathies, not elsewhere classified, right shoulder: Secondary | ICD-10-CM | POA: Diagnosis not present

## 2021-12-16 HISTORY — PX: REVERSE SHOULDER ARTHROPLASTY: SHX5054

## 2021-12-16 LAB — GLUCOSE, CAPILLARY: Glucose-Capillary: 92 mg/dL (ref 70–99)

## 2021-12-16 SURGERY — ARTHROPLASTY, SHOULDER, TOTAL, REVERSE
Anesthesia: General | Site: Shoulder | Laterality: Right

## 2021-12-16 MED ORDER — ALBUMIN HUMAN 5 % IV SOLN
INTRAVENOUS | Status: AC
  Filled 2021-12-16: qty 250

## 2021-12-16 MED ORDER — ONDANSETRON HCL 4 MG/2ML IJ SOLN
INTRAMUSCULAR | Status: AC
  Filled 2021-12-16: qty 2

## 2021-12-16 MED ORDER — OXYCODONE HCL 5 MG/5ML PO SOLN: 5.0000 mg | Freq: Once | ORAL | Status: DC | PRN

## 2021-12-16 MED ORDER — CEFAZOLIN SODIUM-DEXTROSE 2-4 GM/100ML-% IV SOLN
2.0000 g | INTRAVENOUS | Status: AC
  Administered 2021-12-16: 2 g via INTRAVENOUS
  Filled 2021-12-16: qty 100

## 2021-12-16 MED ORDER — PHENYLEPHRINE HCL-NACL 20-0.9 MG/250ML-% IV SOLN
INTRAVENOUS | Status: AC
  Filled 2021-12-16: qty 250

## 2021-12-16 MED ORDER — PHENYLEPHRINE 80 MCG/ML (10ML) SYRINGE FOR IV PUSH (FOR BLOOD PRESSURE SUPPORT)
PREFILLED_SYRINGE | INTRAVENOUS | Status: DC | PRN
  Administered 2021-12-16: 80 ug via INTRAVENOUS
  Administered 2021-12-16: 160 ug via INTRAVENOUS

## 2021-12-16 MED ORDER — LACTATED RINGERS IV BOLUS
250.0000 mL | Freq: Once | INTRAVENOUS | Status: AC
  Administered 2021-12-16: 250 mL via INTRAVENOUS

## 2021-12-16 MED ORDER — DEXAMETHASONE SODIUM PHOSPHATE 10 MG/ML IJ SOLN
INTRAMUSCULAR | Status: AC
  Filled 2021-12-16: qty 1

## 2021-12-16 MED ORDER — ALBUMIN HUMAN 5 % IV SOLN: INTRAVENOUS | Status: DC | PRN

## 2021-12-16 MED ORDER — MELOXICAM 7.5 MG PO TABS: 7.5000 mg | ORAL_TABLET | Freq: Two times a day (BID) | ORAL | 1 refills | Status: AC

## 2021-12-16 MED ORDER — EPHEDRINE SULFATE-NACL 50-0.9 MG/10ML-% IV SOSY
PREFILLED_SYRINGE | INTRAVENOUS | Status: DC | PRN
  Administered 2021-12-16: 15 mg via INTRAVENOUS

## 2021-12-16 MED ORDER — VANCOMYCIN HCL 1000 MG IV SOLR
INTRAVENOUS | Status: DC | PRN
  Administered 2021-12-16: 1000 mg

## 2021-12-16 MED ORDER — FENTANYL CITRATE PF 50 MCG/ML IJ SOSY: 25.0000 ug | PREFILLED_SYRINGE | INTRAMUSCULAR | Status: DC | PRN

## 2021-12-16 MED ORDER — PROPOFOL 10 MG/ML IV BOLUS
INTRAVENOUS | Status: DC | PRN
  Administered 2021-12-16: 150 mg via INTRAVENOUS

## 2021-12-16 MED ORDER — VANCOMYCIN HCL 1000 MG IV SOLR
INTRAVENOUS | Status: AC
  Filled 2021-12-16: qty 20

## 2021-12-16 MED ORDER — ROCURONIUM BROMIDE 10 MG/ML (PF) SYRINGE
PREFILLED_SYRINGE | INTRAVENOUS | Status: DC | PRN
  Administered 2021-12-16: 10 mg via INTRAVENOUS
  Administered 2021-12-16: 15 mg via INTRAVENOUS
  Administered 2021-12-16: 60 mg via INTRAVENOUS

## 2021-12-16 MED ORDER — OXYCODONE HCL 5 MG PO TABS: 5.0000 mg | ORAL_TABLET | Freq: Once | ORAL | Status: DC | PRN

## 2021-12-16 MED ORDER — TRANEXAMIC ACID-NACL 1000-0.7 MG/100ML-% IV SOLN
1000.0000 mg | INTRAVENOUS | Status: AC
  Administered 2021-12-16: 1000 mg via INTRAVENOUS
  Filled 2021-12-16: qty 100

## 2021-12-16 MED ORDER — LACTATED RINGERS IV SOLN: INTRAVENOUS | Status: DC

## 2021-12-16 MED ORDER — BUPIVACAINE HCL (PF) 0.5 % IJ SOLN: INTRAMUSCULAR | Status: DC | PRN

## 2021-12-16 MED ORDER — FENTANYL CITRATE PF 50 MCG/ML IJ SOSY
50.0000 ug | PREFILLED_SYRINGE | Freq: Once | INTRAMUSCULAR | Status: AC
  Administered 2021-12-16: 50 ug via INTRAVENOUS
  Filled 2021-12-16: qty 2

## 2021-12-16 MED ORDER — PROMETHAZINE HCL 25 MG/ML IJ SOLN: 6.2500 mg | INTRAMUSCULAR | Status: DC | PRN

## 2021-12-16 MED ORDER — STERILE WATER FOR IRRIGATION IR SOLN
Status: DC | PRN
  Administered 2021-12-16: 2000 mL

## 2021-12-16 MED ORDER — ROCURONIUM BROMIDE 10 MG/ML (PF) SYRINGE
PREFILLED_SYRINGE | INTRAVENOUS | Status: AC
  Filled 2021-12-16: qty 10

## 2021-12-16 MED ORDER — CHLORHEXIDINE GLUCONATE 0.12 % MT SOLN: 15.0000 mL | Freq: Once | OROMUCOSAL | Status: DC

## 2021-12-16 MED ORDER — SUGAMMADEX SODIUM 200 MG/2ML IV SOLN
INTRAVENOUS | Status: DC | PRN
  Administered 2021-12-16: 250 mg via INTRAVENOUS

## 2021-12-16 MED ORDER — PHENYLEPHRINE HCL-NACL 20-0.9 MG/250ML-% IV SOLN
INTRAVENOUS | Status: DC | PRN
  Administered 2021-12-16: 50 ug/min via INTRAVENOUS

## 2021-12-16 MED ORDER — LACTATED RINGERS IV BOLUS
500.0000 mL | Freq: Once | INTRAVENOUS | Status: AC
  Administered 2021-12-16: 500 mL via INTRAVENOUS

## 2021-12-16 MED ORDER — BUPIVACAINE LIPOSOME 1.3 % IJ SUSP
INTRAMUSCULAR | Status: DC | PRN
  Administered 2021-12-16: 133 mg via PERINEURAL

## 2021-12-16 MED ORDER — ONDANSETRON HCL 4 MG PO TABS: 4.0000 mg | ORAL_TABLET | Freq: Three times a day (TID) | ORAL | 0 refills | Status: AC | PRN

## 2021-12-16 MED ORDER — MIDAZOLAM HCL 2 MG/2ML IJ SOLN
INTRAMUSCULAR | Status: AC
  Filled 2021-12-16: qty 2

## 2021-12-16 MED ORDER — DEXAMETHASONE SODIUM PHOSPHATE 10 MG/ML IJ SOLN
INTRAMUSCULAR | Status: DC | PRN
  Administered 2021-12-16: 6 mg via INTRAVENOUS

## 2021-12-16 MED ORDER — ORAL CARE MOUTH RINSE: 15.0000 mL | Freq: Once | OROMUCOSAL | Status: DC

## 2021-12-16 MED ORDER — LIDOCAINE 2% (20 MG/ML) 5 ML SYRINGE
INTRAMUSCULAR | Status: DC | PRN
  Administered 2021-12-16: 100 mg via INTRAVENOUS

## 2021-12-16 MED ORDER — CHLORHEXIDINE GLUCONATE 0.12 % MT SOLN
15.0000 mL | Freq: Once | OROMUCOSAL | Status: AC
  Administered 2021-12-16: 15 mL via OROMUCOSAL

## 2021-12-16 MED ORDER — TIZANIDINE HCL 4 MG PO TABS: 4.0000 mg | ORAL_TABLET | Freq: Three times a day (TID) | ORAL | 1 refills | Status: AC | PRN

## 2021-12-16 MED ORDER — MIDAZOLAM HCL 2 MG/2ML IJ SOLN
1.0000 mg | Freq: Once | INTRAMUSCULAR | Status: AC
  Administered 2021-12-16 (×2): 1 mg via INTRAVENOUS
  Filled 2021-12-16: qty 2

## 2021-12-16 MED ORDER — PROPOFOL 10 MG/ML IV BOLUS
INTRAVENOUS | Status: AC
  Filled 2021-12-16: qty 20

## 2021-12-16 MED ORDER — 0.9 % SODIUM CHLORIDE (POUR BTL) OPTIME
TOPICAL | Status: DC | PRN
  Administered 2021-12-16: 1000 mL

## 2021-12-16 MED ORDER — FENTANYL CITRATE (PF) 100 MCG/2ML IJ SOLN
INTRAMUSCULAR | Status: AC
  Filled 2021-12-16: qty 2

## 2021-12-16 MED ORDER — FENTANYL CITRATE (PF) 100 MCG/2ML IJ SOLN
INTRAMUSCULAR | Status: DC | PRN
  Administered 2021-12-16: 50 ug via INTRAVENOUS

## 2021-12-16 MED ORDER — ONDANSETRON HCL 4 MG/2ML IJ SOLN
INTRAMUSCULAR | Status: DC | PRN
  Administered 2021-12-16: 4 mg via INTRAVENOUS

## 2021-12-16 MED ORDER — LIDOCAINE HCL (PF) 2 % IJ SOLN
INTRAMUSCULAR | Status: AC
  Filled 2021-12-16: qty 5

## 2021-12-16 MED ORDER — ORAL CARE MOUTH RINSE: 15.0000 mL | Freq: Once | OROMUCOSAL | Status: AC

## 2021-12-16 MED ORDER — OXYCODONE-ACETAMINOPHEN 5-325 MG PO TABS: 1.0000 | ORAL_TABLET | ORAL | 0 refills | Status: AC | PRN

## 2021-12-16 MED ORDER — ACETAMINOPHEN 500 MG PO TABS
1000.0000 mg | ORAL_TABLET | Freq: Once | ORAL | Status: AC
  Administered 2021-12-16: 1000 mg via ORAL
  Filled 2021-12-16: qty 2

## 2021-12-16 MED ORDER — BUPIVACAINE HCL (PF) 0.5 % IJ SOLN
INTRAMUSCULAR | Status: DC | PRN
  Administered 2021-12-16: 10 mL via PERINEURAL

## 2021-12-16 SURGICAL SUPPLY — 72 items
BAG COUNTER SPONGE SURGICOUNT (BAG) IMPLANT
BAG ZIPLOCK 12X15 (MISCELLANEOUS) ×1 IMPLANT
BLADE SAW SGTL 83.5X18.5 (BLADE) ×1 IMPLANT
BNDG COHESIVE 4X5 TAN STRL LF (GAUZE/BANDAGES/DRESSINGS) ×1 IMPLANT
COOLER ICEMAN CLASSIC (MISCELLANEOUS) ×1 IMPLANT
COVER BACK TABLE 60X90IN (DRAPES) ×1 IMPLANT
COVER SURGICAL LIGHT HANDLE (MISCELLANEOUS) ×1 IMPLANT
CUP SUT UNIV REVERS 39 NEU (Shoulder) IMPLANT
DERMABOND ADVANCED .7 DNX12 (GAUZE/BANDAGES/DRESSINGS) ×1 IMPLANT
DRAPE INCISE IOBAN 66X45 STRL (DRAPES) IMPLANT
DRAPE ORTHO SPLIT 77X108 STRL (DRAPES) ×2
DRAPE SHEET LG 3/4 BI-LAMINATE (DRAPES) ×1 IMPLANT
DRAPE SURG 17X11 SM STRL (DRAPES) ×1 IMPLANT
DRAPE SURG ORHT 6 SPLT 77X108 (DRAPES) ×2 IMPLANT
DRAPE TOP 10253 STERILE (DRAPES) ×1 IMPLANT
DRAPE U-SHAPE 47X51 STRL (DRAPES) ×1 IMPLANT
DRESSING AQUACEL AG SP 3.5X6 (GAUZE/BANDAGES/DRESSINGS) ×1 IMPLANT
DRSG AQUACEL AG ADV 3.5X10 (GAUZE/BANDAGES/DRESSINGS) IMPLANT
DRSG AQUACEL AG SP 3.5X6 (GAUZE/BANDAGES/DRESSINGS) ×1
DRSG TEGADERM 8X12 (GAUZE/BANDAGES/DRESSINGS) ×1 IMPLANT
DURAPREP 26ML APPLICATOR (WOUND CARE) ×1 IMPLANT
ELECT BLADE TIP CTD 4 INCH (ELECTRODE) ×1 IMPLANT
ELECT PENCIL ROCKER SW 15FT (MISCELLANEOUS) ×1 IMPLANT
ELECT REM PT RETURN 15FT ADLT (MISCELLANEOUS) ×1 IMPLANT
FACESHIELD WRAPAROUND (MASK) ×5 IMPLANT
FACESHIELD WRAPAROUND OR TEAM (MASK) ×5 IMPLANT
GLENOID UNI REV MOD 24 +2 LAT (Joint) IMPLANT
GLENOSPHERE 39+4 LAT/24 UNI RV (Joint) IMPLANT
GLOVE BIO SURGEON STRL SZ7.5 (GLOVE) ×1 IMPLANT
GLOVE BIO SURGEON STRL SZ8 (GLOVE) ×1 IMPLANT
GLOVE SS BIOGEL STRL SZ 7 (GLOVE) ×1 IMPLANT
GLOVE SS BIOGEL STRL SZ 7.5 (GLOVE) ×1 IMPLANT
GOWN STRL SURGICAL XL XLNG (GOWN DISPOSABLE) ×2 IMPLANT
INSERT HUMERAL MED 39/ +3 (Shoulder) IMPLANT
INSERT MEDIUM HUMERAL 39/ +3 (Shoulder) ×1 IMPLANT
KIT BASIN OR (CUSTOM PROCEDURE TRAY) ×1 IMPLANT
KIT TURNOVER KIT A (KITS) IMPLANT
MANIFOLD NEPTUNE II (INSTRUMENTS) ×1 IMPLANT
NDL TAPERED W/ NITINOL LOOP (MISCELLANEOUS) ×1 IMPLANT
NEEDLE TAPERED W/ NITINOL LOOP (MISCELLANEOUS) ×1 IMPLANT
NS IRRIG 1000ML POUR BTL (IV SOLUTION) ×1 IMPLANT
PACK SHOULDER (CUSTOM PROCEDURE TRAY) ×1 IMPLANT
PAD ARMBOARD 7.5X6 YLW CONV (MISCELLANEOUS) ×1 IMPLANT
PAD COLD SHLDR WRAP-ON (PAD) ×1 IMPLANT
PAD ORTHO SHOULDER 7X19 LRG (SOFTGOODS) IMPLANT
PIN NITINOL TARGETER 2.8 (PIN) IMPLANT
PIN SET MODULAR GLENOID SYSTEM (PIN) IMPLANT
RESTRAINT HEAD UNIVERSAL NS (MISCELLANEOUS) ×1 IMPLANT
SCREW CENTRAL MOD 30MM (Screw) IMPLANT
SCREW PERI LOCK 5.5X24 (Screw) IMPLANT
SCREW PERI LOCK 5.5X32 (Screw) IMPLANT
SCREW PERIPHERAL 5.5X20 LOCK (Screw) IMPLANT
SCREW PERIPHERAL 5.5X28 LOCK (Screw) IMPLANT
SLING ARM FOAM STRAP LRG (SOFTGOODS) IMPLANT
SLING ARM FOAM STRAP MED (SOFTGOODS) IMPLANT
SPACER SHLD UNI REV 39 +9 (Shoulder) IMPLANT
SPONGE T-LAP 4X18 ~~LOC~~+RFID (SPONGE) ×1 IMPLANT
STEM HUMERAL UNI REV SIZE 12 (Stem) IMPLANT
SUCTION FRAZIER HANDLE 12FR (TUBING) ×1
SUCTION TUBE FRAZIER 12FR DISP (TUBING) ×1 IMPLANT
SUT FIBERWIRE #2 38 T-5 BLUE (SUTURE)
SUT MNCRL AB 3-0 PS2 18 (SUTURE) ×1 IMPLANT
SUT MON AB 2-0 CT1 36 (SUTURE) ×1 IMPLANT
SUT VIC AB 1 CT1 36 (SUTURE) ×1 IMPLANT
SUTURE FIBERWR #2 38 T-5 BLUE (SUTURE) IMPLANT
SUTURE TAPE 1.3 40 TPR END (SUTURE) ×2 IMPLANT
SUTURETAPE 1.3 40 TPR END (SUTURE) ×2
TOWEL OR 17X26 10 PK STRL BLUE (TOWEL DISPOSABLE) ×1 IMPLANT
TOWEL OR NON WOVEN STRL DISP B (DISPOSABLE) ×1 IMPLANT
TUBE SUCTION HIGH CAP CLEAR NV (SUCTIONS) ×1 IMPLANT
TUBING CONNECTING 10 (TUBING) IMPLANT
WATER STERILE IRR 1000ML POUR (IV SOLUTION) ×2 IMPLANT

## 2021-12-16 NOTE — Op Note (Signed)
12/16/2021  1:47 PM  PATIENT:   Gabriel Simpson  76 y.o. male  PRE-OPERATIVE DIAGNOSIS:  right shoulder rotator cuff tear arthropathy  POST-OPERATIVE DIAGNOSIS: Same  PROCEDURE: Right shoulder reverse arthroplasty utilizing a press-fit size 12 Arthrex stem with a neutral metaphysis, +9 spacer, +3 polyethylene insert, 39/+4 glenosphere and a small/+2 baseplate  SURGEON:  Areyanna Figeroa, Vania Rea M.D.  ASSISTANTS: Ralene Bathe, PA-C  Ralene Bathe, PA-C was utilized as an Geophysicist/field seismologist throughout this case, essential for help with positioning the patient, positioning extremity, tissue manipulation, implantation of the prosthesis, suture management, wound closure, and intraoperative decision-making.  ANESTHESIA:   General endotracheal interscalene block with Exparel  EBL: 250 cc  SPECIMEN: None  Drains: None   PATIENT DISPOSITION:  PACU - hemodynamically stable.    PLAN OF CARE: Discharge to home after PACU  Brief history:  Gabriel Simpson is a 76 year old gentleman followed for chronic and progressively increasing right shoulder pain related to severe osteoarthritis.  Due to his increasing functional limitations and failure to respond to prolonged attempts of conservative management, he has brought to the operating room this time for planned right shoulder reverse arthroplasty.  Preoperatively, I counseled the patient regarding treatment options and risks versus benefits thereof.  Possible surgical complications were all reviewed including potential for bleeding, infection, neurovascular injury, persistent pain, loss of motion, anesthetic complication, failure of the implant, and possible need for additional surgery. They understand and accept and agrees with our planned procedure.  Procedure detail:  After undergoing routine preop evaluation the patient received prophylactic antibiotics and interscalene block with Exparel was established in the holding area by the anesthesia department.   Subsequently placed supine on the operating table and underwent the smooth induction of a general endotracheal anesthesia.  Placed into the beachchair position and appropriately padded and protected.  The right shoulder region was sterilely prepped and draped in standard fashion.  Timeout was called.  A deltopectoral approach to the right shoulder is made an approximately 10 cm incision.  Skin flaps were elevated and dissection carried deeply with the deltopectoral interval open from proximal to distal with the vein taken laterally.  The conjoined tendon was mobilized and retracted medially.  Upper centimeter the pectoralis major was tenotomized for exposure.  Long head biceps tendon was tenodesed at the upper border of the pectoralis major tendon and the proximal segment was unroofed and excised.  The rotator cuff was then split from the apex of the bicipital groove to the base of the coracoid and the subscap was elevated from the lesser tuberosity using electrocautery the free margin was tagged with a pair of grasping suture tape sutures.  Capsular attachments were then divided from the anterior and inferior margins of the humeral neck and the humeral head was then delivered through the wound.  An extramedullary guide was then used to outline the proposed humeral head resection which was performed with an oscillating saw at approximately 20 degrees of retroversion.  The large peripheral osteophytes were then removed with a rondure and a metal cap was then placed over the cut proximal humeral surface.  Within expose the glenoid and performed a circumferential labral resection.  A guidepin was directed into the center of the glenoid and the glenoid was then reamed with a central followed by the peripheral reamer to a stable subchondral bony bed.  Preparation completed with central drill and tap for 30 mm lag screw.  Our baseplate was then assembled and inserted with vancomycin powder applied to the threads  of the  lag screw and excellent purchase and fixation was achieved.  All of the peripheral locking screws were then placed using standard technique.  Excellent fixation was achieved.  A 39/+4 glenosphere was then impacted onto the baseplate and the central locking screw was placed.  Attention was then returned to the humeral metaphysis where the canal was opened we ultimately broached up to a size 12 implant.  A neutral metaphyseal reaming guide was then used to prepare the metaphysis.  A trial implant was placed and trial reduction showed good motion stability and soft tissue balance.  This point our trial was then removed.  The final implant was assembled.  The canal was irrigated cleaned and dried with vancomycin powder sprayed liberally into the canal.  Final implant was then seated with excellent fixation.  We then performed a series of trouble ductions and ultimately felt that +9 off the implant gave his this motion stability and soft tissue balance.  This point then the implants cleaned and dried a +9 spacer was placed followed by a +3 poly-.  Our final reduction was then performed which showed excellent motion and stability and soft tissue balance all much to our satisfaction.  The wound was then copiously irrigated.  Final hemostasis was obtained.  The subscapularis was mobilized and demonstrated good elasticity for repair to the eyelets of the collar of the implant using the previously placed suture tape sutures.  The balance of the vancomycin powder was then sprayed liberally throughout the deep soft tissue planes.  The deltopectoral interval was reapproximated with a series of figure-of-eight #1 Vicryl sutures.  2-0 Monocryl used to close the subcu layer and intracuticular 3-0 Monocryl used to close the skin followed by Dermabond and Aquacel dressing.  The right arm was placed into a sling and the patient was awakened, extubated, taken to the recovery in stable condition.  Senaida Lange MD    Contact #  [phone]

## 2021-12-16 NOTE — Evaluation (Signed)
Occupational Therapy Evaluation Patient Details Name: Gabriel Simpson MRN: [id number] DOB: January 29, 1945 Today's Date: 12/16/2021   History of Present Illness Gabriel Simpson is s/p a R shoulder reverse arthroplasty on 12-16-2021, due to R shoulder rotator cuff tear arthropathy.   Clinical Impression   Patient is s/p shoulder replacement without functional use of right non-dominant upper extremity secondary to effects of surgery and interscalene block and shoulder precautions. Therapist provided education and instruction to patient and spouse with regards to ROM/exercises, post-op precautions, UE positioning, donning upper extremity clothing, recommendations for bathing while maintaining shoulder precautions, use of ice for pain and edema management and correctly donning/doffing sling. Patient and spouse verbalized understanding and demonstrated understanding as needed. Patient needed assistance to donn shirt and sling, with instruction provided on compensatory strategies to perform ADLs. Patient to follow up with MD for further therapy needs.        Recommendations for follow up therapy are one component of a multi-disciplinary discharge planning process, led by the attending physician.  Recommendations may be updated based on patient status, additional functional criteria and insurance authorization.   Follow Up Recommendations  Follow physician's recommendations for discharge plan and follow up therapies     Assistance Recommended at Discharge Intermittent Supervision/Assistance  Patient can return home with the following Help with stairs or ramp for entrance;Assistance with cooking/housework;Assist for transportation;A little help with bathing/dressing/bathroom    Functional Status Assessment  Patient has had a recent decline in their functional status and demonstrates the ability to make significant improvements in function in a reasonable and predictable amount of time.  Equipment  Recommendations  None recommended by OT       Precautions / Restrictions Precautions Precautions: Shoulder Type of Shoulder Precautions: If sitting in controlled environment, ok to come out of sling to give neck a break. Please sleep in it to protect until follow up in office.     OK to use operative arm for feeding, hygiene and ADLs.   Ok to instruct Pendulums and lap slides as exercises. Ok to use operative arm within the following parameters for ADL purposes     New ROM (7/86)   Ok for PROM, AAROM, AROM within pain tolerance and within the following ROM   ER 20   ABD 45   FE 60 Restrictions Weight Bearing Restrictions: Yes RUE Weight Bearing: Non weight bearing      Mobility Bed Mobility      General bed mobility comments: pt received seated in bedside chair    Transfers Overall transfer level: Needs assistance   Transfers: Sit to/from Stand Sit to Stand: Supervision                      ADL either performed or assessed with clinical judgement      Vision Baseline Vision/History: 1 Wears glasses Patient Visual Report: No change from baseline              Pertinent Vitals/Pain Pain Assessment Pain Assessment: No/denies pain     Hand Dominance Left      Communication Communication Communication: No difficulties   Cognition Arousal/Alertness: Awake/alert Behavior During Therapy: WFL for tasks assessed/performed Overall Cognitive Status: Within Functional Limits for tasks assessed        General Comments: Oriented x4, able to follow commands           Shoulder Instructions Shoulder Instructions Donning/doffing shirt without moving shoulder: Minimal assistance Method for sponge bathing under operated UE: Caregiver independent with  task Donning/doffing sling/immobilizer: Minimal assistance Correct positioning of sling/immobilizer: Caregiver independent with task Pendulum exercises (written home exercise program): Caregiver independent with  task ROM for elbow, wrist and digits of operated UE: Caregiver independent with task Sling wearing schedule (on at all times/off for ADL's): Caregiver independent with task Proper positioning of operated UE when showering: Caregiver independent with task Positioning of UE while sleeping: Caregiver independent with task    Home Living Family/patient expects to be discharged to:: Private residence Living Arrangements: Spouse/significant other Available Help at Discharge: Family Type of Home: House Home Access: Stairs to enter Secretary/administrator of Steps: 5 Entrance Stairs-Rails: Left;Right Home Layout: Two level;Able to live on main level with bedroom/bathroom     Bathroom Shower/Tub: Walk-in shower         Home Equipment: Educational psychologist (4 wheels);Wheelchair - manual;Cane - single point          Prior Functioning/Environment Prior Level of Function : Independent/Modified Independent             Mobility Comments: Occasional use of cane ADLs Comments: Independent with ADLs and driving        OT Problem List: Impaired UE functional use      OT Treatment/Interventions:   N/A      OT Frequency:  N/A       AM-PAC OT "6 Clicks" Daily Activity     Outcome Measure Help from another person eating meals?: None Help from another person taking care of personal grooming?: None Help from another person toileting, which includes using toliet, bedpan, or urinal?: None Help from another person bathing (including washing, rinsing, drying)?: A Little Help from another person to put on and taking off regular upper body clothing?: A Little Help from another person to put on and taking off regular lower body clothing?: A Little 6 Click Score: 21   End of Session Nurse Communication: Other (comment) (nurse informed of shoulder instruction completed)  Activity Tolerance: Patient tolerated treatment well Patient left: in chair;with family/visitor present;with call  bell/phone within reach  OT Visit Diagnosis: Muscle weakness (generalized) (M62.81)                Time: 7939-0300 OT Time Calculation (min): 29 min Charges:  OT General Charges $OT Visit: 1 Visit OT Evaluation $OT Eval Low Complexity: 1 Low OT Treatments $Self Care/Home Management : 8-22 mins    Reuben Likes, OTR/L 12/16/2021, 4:54 PM

## 2021-12-16 NOTE — Transfer of Care (Signed)
Immediate Anesthesia Transfer of Care Note  Patient: Gabriel Simpson  Procedure(s) Performed: REVERSE SHOULDER ARTHROPLASTY (Right: Shoulder)  Patient Location: PACU  Anesthesia Type:General  Level of Consciousness: awake, alert , oriented, and patient cooperative  Airway & Oxygen Therapy: Patient Spontanous Breathing and Patient connected to face mask oxygen  Post-op Assessment: Report given to RN and Post -op Vital signs reviewed and stable  Post vital signs: Reviewed and stable  Last Vitals:  Vitals Value Taken Time  BP 130/69 12/16/21 1412  Temp    Pulse 79 12/16/21 1414  Resp 18 12/16/21 1414  SpO2 100 % 12/16/21 1414  Vitals shown include unvalidated device data.  Last Pain:  Vitals:   12/16/21 1057  TempSrc:   PainSc: 0-No pain         Complications: No notable events documented.

## 2021-12-16 NOTE — Anesthesia Procedure Notes (Addendum)
Anesthesia Regional Block: Interscalene brachial plexus block   Pre-Anesthetic Checklist: , timeout performed,  Correct Patient, Correct Site, Correct Laterality,  Correct Procedure, Correct Position, site marked,  Risks and benefits discussed,  Surgical consent,  Pre-op evaluation,  At surgeon's request and post-op pain management  Laterality: Right  Prep: chloraprep       Needles:  Injection technique: Single-shot  Needle Type: Echogenic Stimulator Needle     Needle Length: 9cm  Needle Gauge: 21     Additional Needles:   Procedures:,,,, ultrasound used (permanent image in chart),,    Narrative:  Start time: 12/16/2021 10:48 AM End time: 12/16/2021 10:52 AM Injection made incrementally with aspirations every 5 mL.  Performed by: Personally  Anesthesiologist: Linton Rump, MD  Additional Notes: Discussed risks and benefits of nerve block including, but not limited to, prolonged and/or permanent nerve injury involving sensory and/or motor function. Monitors were applied and a time-out was performed. The nerve and associated structures were visualized under ultrasound guidance. After negative aspiration, local anesthetic was slowly injected around the nerve. There was no evidence of high pressure during the procedure. There were no paresthesias. VSS remained stable and the patient tolerated the procedure well.

## 2021-12-16 NOTE — H&P (Signed)
Gabriel Simpson    Chief Complaint: right shoulder rotator cuff tear arthropathy HPI: The patient is a 76 y.o. male with chronic and progressive increasing right shoulder pain related to severe rotator cuff tear arthropathy.  Due to his increasing functional limitations and failure to respond to prolonged attempts at conservative management, he is brought to the operating this time for planned right shoulder reverse arthroplasty  Past Medical History:  Diagnosis Date   ADHD (attention deficit hyperactivity disorder)    Allergy    Anxiety    Arthritis    Asthma    Complication of anesthesia    problem with decrease HR and respirations very low   Hyperlipidemia    Hypertension    Pre-diabetes       Past Surgical History:  Procedure Laterality Date   CERVICAL FUSION  2016   ACDF   COLONOSCOPY     EYE SURGERY Bilateral 2020   cataract   LUMBAR FUSION  2010   T7-T12,  L1-5,   thumb surgery Left    TONSILLECTOMY  1955    Family History  Problem Relation Age of Onset   Arthritis Mother    Cancer Mother    Hearing loss Mother    Alcohol abuse Father    Asthma Father    COPD Father    Arthritis Daughter    Miscarriages / [place] Daughter    Asthma Son    Birth defects Son     Social History:  reports that he has never smoked. He has never used smokeless tobacco. He reports current alcohol use of about 2.0 standard drinks of alcohol per week. He reports current drug use. Drug: Marijuana.  BMI: Estimated body mass index is 24.82 kg/m as calculated from the following:   Height as of 12/09/21: 6' (1.829 m).   Weight as of this encounter: 83 kg.  Lab Results  Component Value Date   ALBUMIN 4.4 05/31/2020   Diabetes: Patient does not have a diagnosis of diabetes. Lab Results  Component Value Date   HGBA1C 6.1 (H) 12/09/2021     Smoking Status:   reports that he has never smoked. He has never used smokeless tobacco.     Medications Prior to Admission   Medication Sig Dispense Refill   albuterol (PROVENTIL HFA;VENTOLIN HFA) 108 (90 Base) MCG/ACT inhaler Inhale 1-2 puffs into the lungs every 6 (six) hours as needed for wheezing or shortness of breath. 1 Inhaler 0   amphetamine-dextroamphetamine (ADDERALL XR) 20 MG 24 hr capsule Take 1 capsule (20 mg total) by mouth every morning. 30 capsule 0   aspirin 81 MG chewable tablet Chew 81 mg by mouth daily.     atorvastatin (LIPITOR) 20 MG tablet Take 20 mg by mouth daily.     cetirizine (ZYRTEC) 5 MG tablet Take 5 mg by mouth daily.     finasteride (PROSCAR) 5 MG tablet Take 5 mg by mouth daily.     Fluocinolone Acetonide 0.01 % OIL Place 2-4 drops in ear(s) every 4 (four) hours as needed (itching).     gabapentin (NEURONTIN) 100 MG capsule Take 200 mg by mouth at bedtime.     glucosamine-chondroitin 500-400 MG tablet Take 1 tablet by mouth 3 (three) times daily.     losartan-hydrochlorothiazide (HYZAAR) 100-12.5 MG tablet Take 1 tablet by mouth daily.     meloxicam (MOBIC) 7.5 MG tablet Take 7.5 mg by mouth in the morning and at bedtime.     metFORMIN (GLUCOPHAGE) 500 MG tablet  Take 500 mg by mouth 2 (two) times daily with a meal.     Multiple Vitamin (MULTIVITAMIN) tablet Take 1 tablet by mouth daily.     Nutritional Supplements (A/G PRO) TABS Take 3 tablets by mouth daily.     sertraline (ZOLOFT) 50 MG tablet Take 1 tablet (50 mg total) by mouth daily. 90 tablet 1   tamsulosin (FLOMAX) 0.4 MG CAPS capsule Take 0.4 mg by mouth daily.     triamcinolone cream (KENALOG) 0.1 % Apply 1 Application topically daily as needed (rash).     ipratropium (ATROVENT) 0.06 % nasal spray Place 2 sprays into both nostrils 4 (four) times daily. 3-4 times/ day (Patient not taking: Reported on 06/01/2020) 15 mL 0   Spacer/Aero-Holding Chambers (AEROCHAMBER PLUS) inhaler Use as instructed (Patient taking differently: 1 each by Other route as directed.) 1 each 2     Physical Exam: Right shoulder demonstrates painful  and guarded motion as noted at his recent office visit.  He has globally decreased strength to manual muscle testing.  He remains otherwise neurovascularly intact.  Plain radiographs reviewed demonstrating severe glenohumeral arthritis with marked bony deformity.  MRI scan confirms severe degenerative chondrosis with bony deformity and marked degeneration of the rotator cuff.  Vitals  Temp:  [97.5 F (36.4 C)] 97.5 F (36.4 C) (12/07 0956) Pulse Rate:  [66] 66 (12/07 0956) Resp:  [18] 18 (12/07 1057) BP: (126-143)/(75-85) 143/85 (12/07 1057) SpO2:  [97 %-99 %] 99 % (12/07 1057) Weight:  [83 kg] 83 kg (12/07 1021)  Assessment/Plan  Impression: right shoulder rotator cuff tear arthropathy  Plan of Action: Procedure(s): REVERSE SHOULDER ARTHROPLASTY  Gabriel Simpson 12/16/2021, 11:20 AM Contact # [phone]

## 2021-12-16 NOTE — Discharge Instructions (Signed)

## 2021-12-16 NOTE — Anesthesia Postprocedure Evaluation (Signed)
Anesthesia Post Note  Patient: Gabriel Simpson  Procedure(s) Performed: REVERSE SHOULDER ARTHROPLASTY (Right: Shoulder)     Patient location during evaluation: PACU Anesthesia Type: General Level of consciousness: awake Pain management: pain level controlled Vital Signs Assessment: post-procedure vital signs reviewed and stable Respiratory status: spontaneous breathing, nonlabored ventilation and respiratory function stable Cardiovascular status: blood pressure returned to baseline and stable Postop Assessment: no apparent nausea or vomiting Anesthetic complications: no   No notable events documented.  Last Vitals:  Vitals:   12/16/21 1445 12/16/21 1459  BP: 115/70 118/67  Pulse: 68 74  Resp: 15 14  Temp:  36.7 C  SpO2: 92% 94%    Last Pain:  Vitals:   12/16/21 1445  TempSrc:   PainSc: 0-No pain                 Linton Rump

## 2021-12-16 NOTE — Anesthesia Procedure Notes (Signed)
Procedure Name: Intubation Date/Time: 12/16/2021 12:14 PM  Performed by: Garrel Ridgel, CRNAPre-anesthesia Checklist: Patient identified, Emergency Drugs available, Suction available and Patient being monitored Patient Re-evaluated:Patient Re-evaluated prior to induction Oxygen Delivery Method: Circle system utilized Preoxygenation: Pre-oxygenation with 100% oxygen Induction Type: IV induction Ventilation: Mask ventilation without difficulty Laryngoscope Size: Mac and 4 Grade View: Grade II Tube type: Oral Tube size: 7.5 mm Number of attempts: 1 Airway Equipment and Method: Stylet and Oral airway Placement Confirmation: ETT inserted through vocal cords under direct vision, positive ETCO2 and breath sounds checked- equal and bilateral Secured at: 24 cm Tube secured with: Tape Dental Injury: Teeth and Oropharynx as per pre-operative assessment

## 2021-12-20 ENCOUNTER — Encounter (HOSPITAL_COMMUNITY): Payer: Self-pay | Admitting: Orthopedic Surgery

## 2022-01-05 IMAGING — CT CT CERVICAL SPINE W/ CM
3 of 4 series · 9 of 33 positions shown, 10 images · IV contrast (agent unspecified)
Comparison: Myelogram images same day.

CLINICAL DATA: Previous cervical fusion.  Neck pain.

EXAM:
CT CERVICAL SPINE WITH CONTRAST
TECHNIQUE: Multidetector CT imaging of the cervical spine was performed during
intravenous contrast administration. Multiplanar CT image
reconstructions were also generated.
CONTRAST:  Intrathecal contrast injected at myelography and
described in that report.

[Series 6: sagittal bone · sagittal · 0.25mm/px · 5 of 61 slices shown]
[im 21/61  bone]
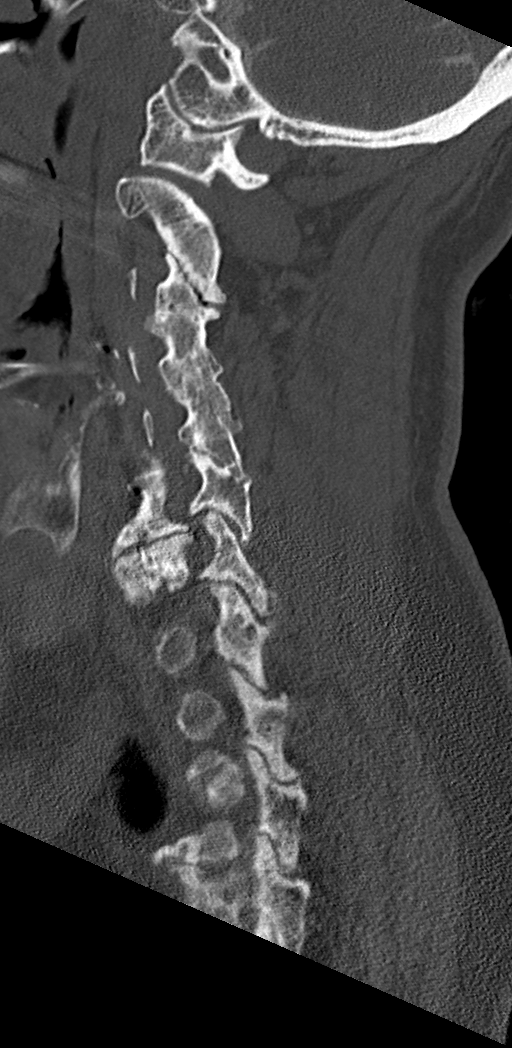
[im 26/61  bone]
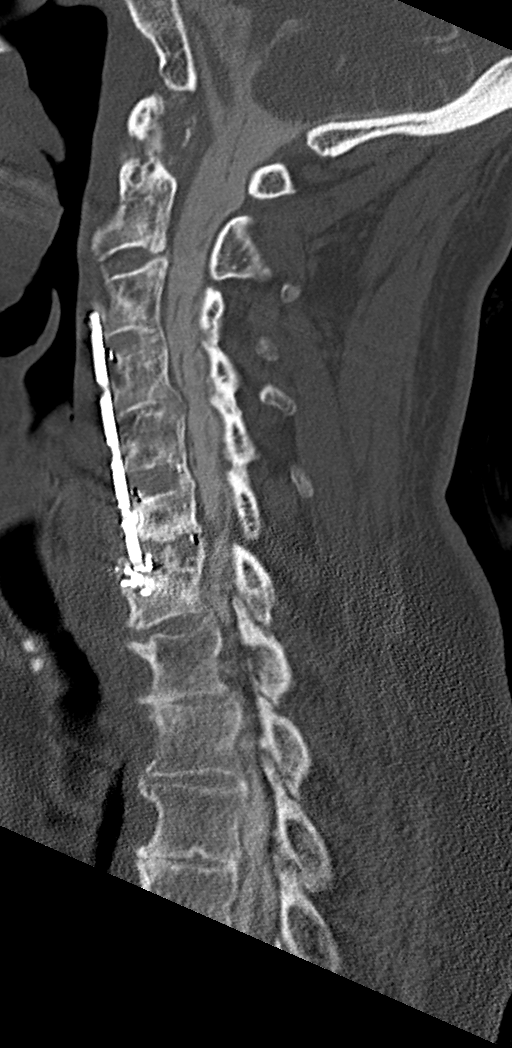
[im 31/61  bone]
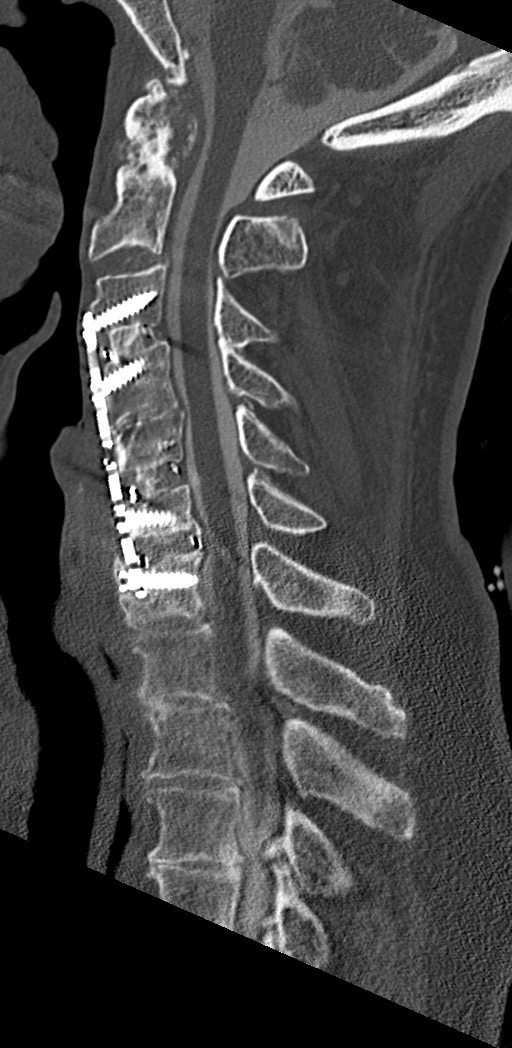
[im 36/61  bone]
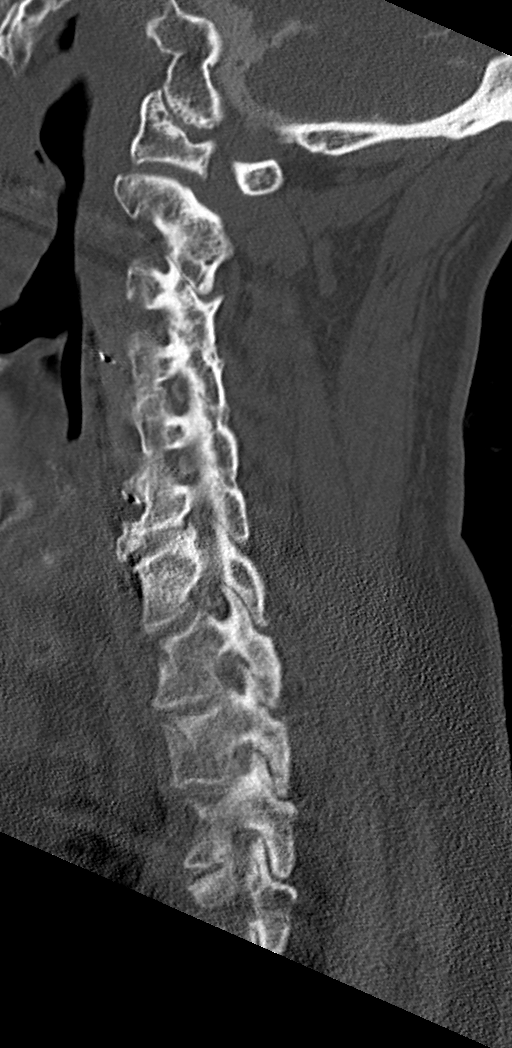
[im 41/61  bone]
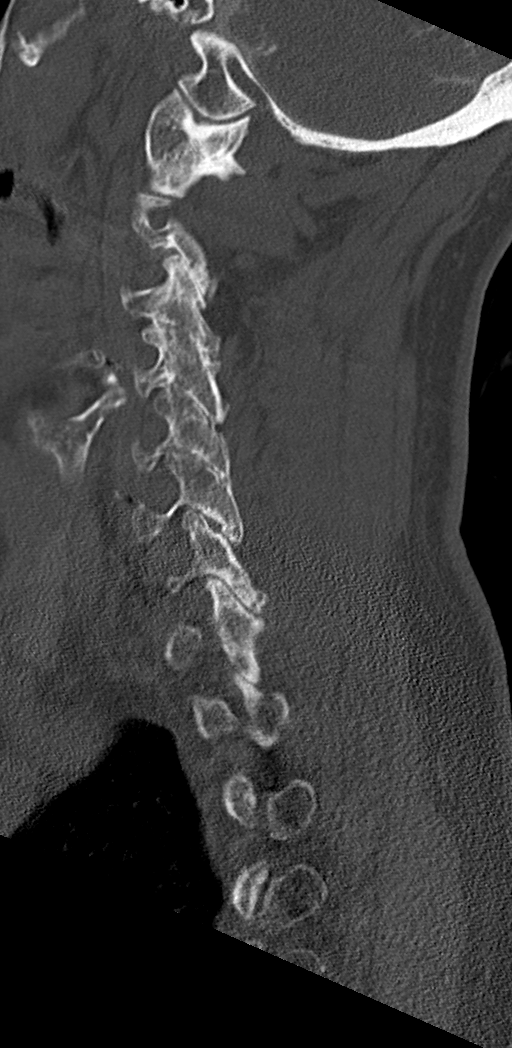

[Series 7: coronal bone · coronal · 0.23mm/px · 3 of 65 slices shown]
[im 14/65  bone]
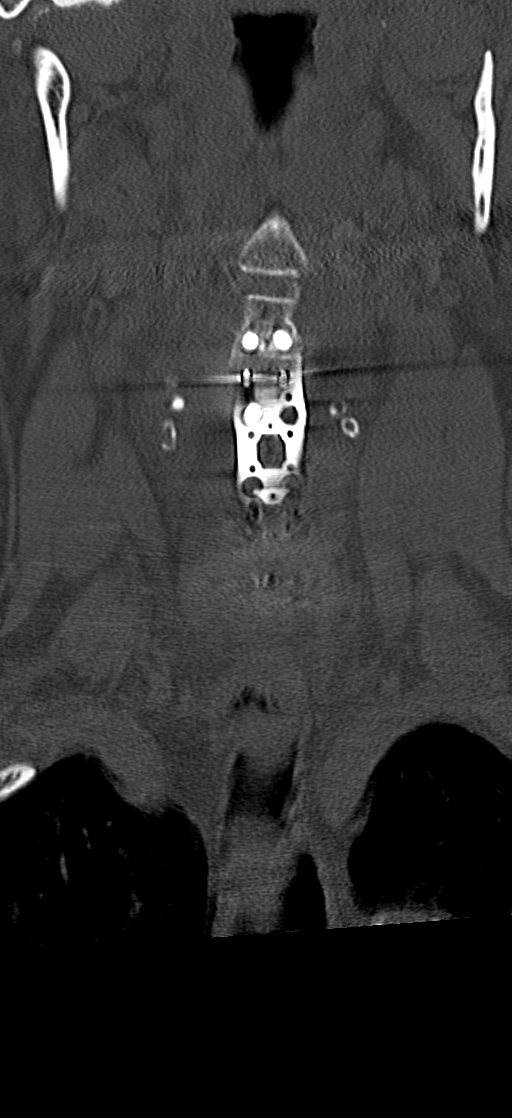
[im 26/65  bone]
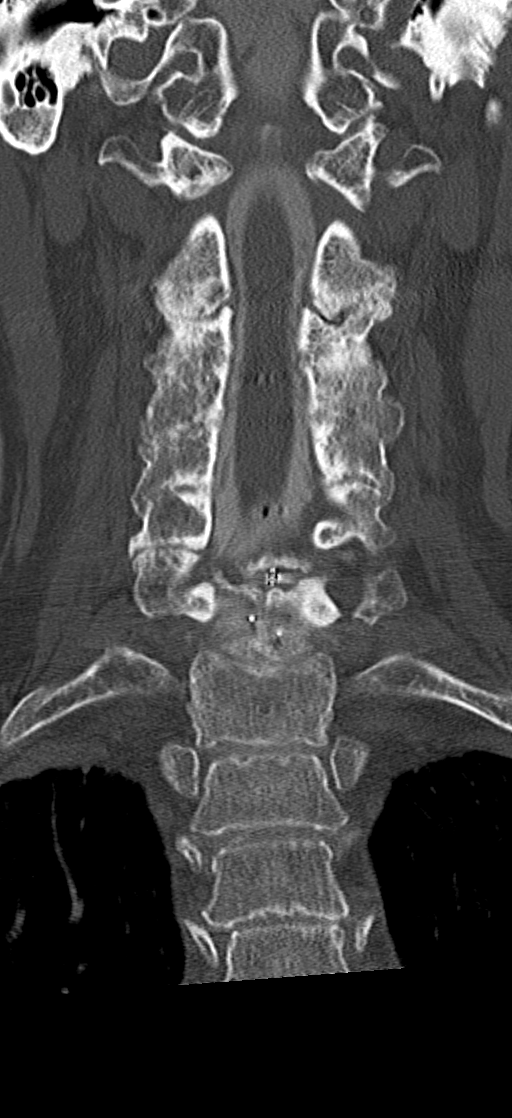
[im 39/65  bone]
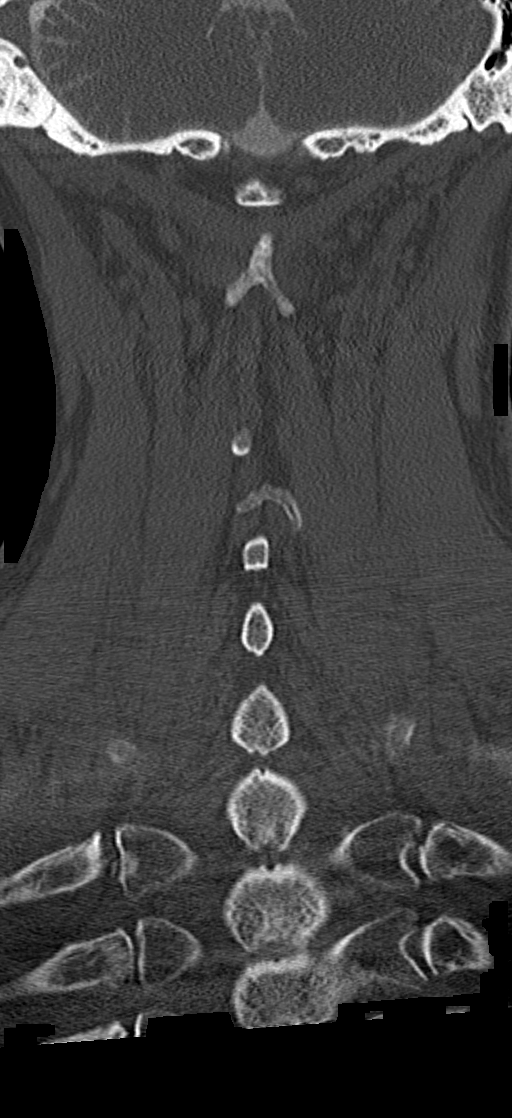

[Series 8: orthogonal bone · axial · 0.23mm/px · z∈[+308,+308]mm · 1 of 132 slices shown, 2 images]
[im 75/132  soft-tissue]
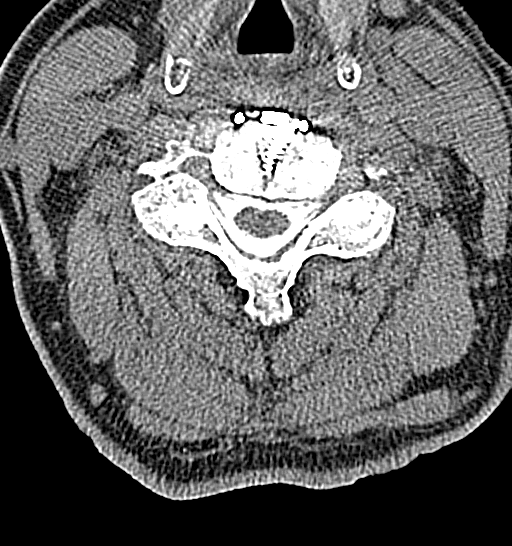
[im 75/132  bone]
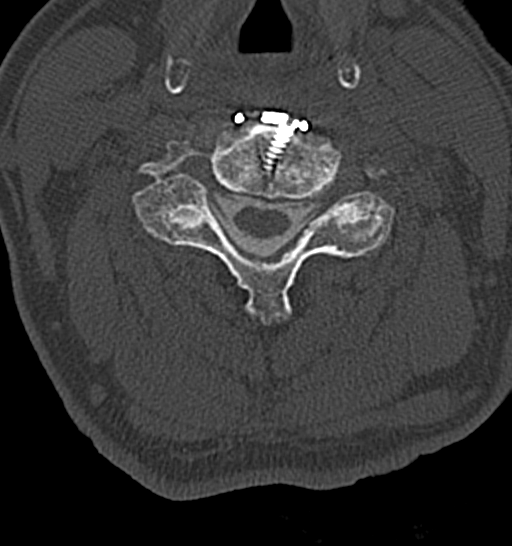

[9 of 33 positions shown; findings below may reference images not displayed]

FINDINGS: Alignment: 3 mm degenerative anterolisthesis T1-2.

Skull base and vertebrae: Previous ACDF from C3 through C7. Definite
solid union from C3 through C6. Solid union across the disc space is
not clearly demonstrated at C6-7, but there is no lucency around the
screws to suggest ongoing motion.

Soft tissues and spinal canal: No significant soft tissue finding.

Disc levels: Foramen magnum is widely patent. Pronounced arthritic
change at the C1-2 articulation but without encroachment upon the
neural structures. This could certainly be associated with neck
pain.

C2-3: No significant disc space pathology. Bilateral facet
osteoarthritis which could contribute to neck pain. No stenosis of
the canal or foramina.

C3 through C7: Previous ACDF as described above. Sufficient patency
of the canal and foramina. As noted above, definite solid fusion
across the disc space at C6-7 is not demonstrated, but there is no
screw loosening to suggest motion.

C7-T1: Bilateral facet osteoarthritis. No canal stenosis. No
compressive foraminal stenosis.

T1-2: Bilateral facet osteoarthritis with 3 mm of degenerative
anterolisthesis. No disc herniation. No compressive canal stenosis.
Mild bilateral foraminal narrowing.

T2-3 and T3-4: These levels also show facet osteoarthritis with
foraminal narrowing. The central canal is sufficiently patent.

Upper chest: Normal

Other: None
IMPRESSION: Previous ACDF C3 through C7. Definite solid union from C3 through
C6. Solid bone fusion across the disc space at C6-7 is not
demonstrated, but there does not appear to be any lucency around the
C6 or C7 screws to suggest ongoing motion. Sufficient patency of the
canal and foramina throughout the fusion segment.

C1-2: Degenerative arthropathy without neural encroachment. This
could contribute to neck pain.

C2-3: Facet osteoarthritis without neural encroachment. This could
contribute to neck pain.

C7-T1: Bilateral facet arthropathy without compressive stenosis.
This could contribute to neck pain.

T1-2: Bilateral facet arthropathy with 3 mm of anterolisthesis. This
could contribute to neck pain. Mild foraminal narrowing but no
likely neural compression.

T2-3 and T3-4: Facet osteoarthritis which could be painful. Mild
bilateral foraminal narrowing, not grossly compressive.

## 2022-02-23 NOTE — Progress Notes (Signed)
This encounter was created in error - please disregard.

## 2023-08-25 ENCOUNTER — Other Ambulatory Visit: Payer: Self-pay | Admitting: Medical Genetics

## 2023-11-14 ENCOUNTER — Other Ambulatory Visit

## 2023-11-15 ENCOUNTER — Other Ambulatory Visit: Payer: Self-pay | Admitting: Infectious Diseases

## 2023-11-15 DIAGNOSIS — R270 Ataxia, unspecified: Secondary | ICD-10-CM

## 2023-11-20 ENCOUNTER — Other Ambulatory Visit: Payer: Self-pay | Admitting: Medical Genetics

## 2023-11-20 DIAGNOSIS — Z006 Encounter for examination for normal comparison and control in clinical research program: Secondary | ICD-10-CM

## 2023-11-24 ENCOUNTER — Ambulatory Visit
Admission: RE | Admit: 2023-11-24 | Discharge: 2023-11-24 | Disposition: A | Discharge status: Home / Self Care | Source: Ambulatory Visit | Attending: Infectious Diseases | Admitting: Infectious Diseases

## 2023-11-24 DIAGNOSIS — R270 Ataxia, unspecified: Secondary | ICD-10-CM | POA: Insufficient documentation
